# Patient Record
Sex: Female | Born: 1970
Health system: Southern US, Community
[De-identification: ages and names within clinical notes are randomized; demographics above are authoritative.]

## PROBLEM LIST (undated history)

## (undated) DIAGNOSIS — I10 Essential (primary) hypertension: Secondary | ICD-10-CM

## (undated) DIAGNOSIS — M2550 Pain in unspecified joint: Secondary | ICD-10-CM

## (undated) HISTORY — DX: Pain in unspecified joint: M25.50

## (undated) HISTORY — PX: TUBAL LIGATION: SHX77

---

## 2019-06-22 DIAGNOSIS — I10 Essential (primary) hypertension: Secondary | ICD-10-CM | POA: Diagnosis not present

## 2019-06-22 DIAGNOSIS — Z0000 Encounter for general adult medical examination without abnormal findings: Secondary | ICD-10-CM | POA: Diagnosis not present

## 2019-06-22 DIAGNOSIS — Z131 Encounter for screening for diabetes mellitus: Secondary | ICD-10-CM | POA: Diagnosis not present

## 2019-06-22 DIAGNOSIS — Z13220 Encounter for screening for lipoid disorders: Secondary | ICD-10-CM | POA: Diagnosis not present

## 2019-06-22 DIAGNOSIS — Z1159 Encounter for screening for other viral diseases: Secondary | ICD-10-CM | POA: Diagnosis not present

## 2019-06-22 DIAGNOSIS — R0602 Shortness of breath: Secondary | ICD-10-CM | POA: Diagnosis not present

## 2019-06-22 DIAGNOSIS — Z114 Encounter for screening for human immunodeficiency virus [HIV]: Secondary | ICD-10-CM | POA: Diagnosis not present

## 2019-06-22 DIAGNOSIS — Z79899 Other long term (current) drug therapy: Secondary | ICD-10-CM | POA: Diagnosis not present

## 2019-06-22 LAB — PULMONARY FUNCTION TEST

## 2019-07-05 DIAGNOSIS — R945 Abnormal results of liver function studies: Secondary | ICD-10-CM | POA: Diagnosis not present

## 2019-07-05 DIAGNOSIS — I10 Essential (primary) hypertension: Secondary | ICD-10-CM | POA: Diagnosis not present

## 2019-07-05 DIAGNOSIS — Z01419 Encounter for gynecological examination (general) (routine) without abnormal findings: Secondary | ICD-10-CM | POA: Diagnosis not present

## 2019-07-05 DIAGNOSIS — R7303 Prediabetes: Secondary | ICD-10-CM | POA: Diagnosis not present

## 2019-07-05 LAB — HM PAP SMEAR: HM Pap smear: NEGATIVE

## 2019-07-12 DIAGNOSIS — R945 Abnormal results of liver function studies: Secondary | ICD-10-CM | POA: Diagnosis not present

## 2019-07-19 DIAGNOSIS — K8020 Calculus of gallbladder without cholecystitis without obstruction: Secondary | ICD-10-CM | POA: Diagnosis not present

## 2019-07-19 DIAGNOSIS — I10 Essential (primary) hypertension: Secondary | ICD-10-CM | POA: Diagnosis not present

## 2019-07-19 DIAGNOSIS — R7303 Prediabetes: Secondary | ICD-10-CM | POA: Diagnosis not present

## 2019-07-19 DIAGNOSIS — N281 Cyst of kidney, acquired: Secondary | ICD-10-CM | POA: Diagnosis not present

## 2019-08-16 DIAGNOSIS — R7303 Prediabetes: Secondary | ICD-10-CM | POA: Diagnosis not present

## 2019-08-16 DIAGNOSIS — K8020 Calculus of gallbladder without cholecystitis without obstruction: Secondary | ICD-10-CM | POA: Diagnosis not present

## 2019-08-16 DIAGNOSIS — I10 Essential (primary) hypertension: Secondary | ICD-10-CM | POA: Diagnosis not present

## 2019-08-16 DIAGNOSIS — N281 Cyst of kidney, acquired: Secondary | ICD-10-CM | POA: Diagnosis not present

## 2020-01-27 DIAGNOSIS — K219 Gastro-esophageal reflux disease without esophagitis: Secondary | ICD-10-CM | POA: Diagnosis not present

## 2020-01-27 DIAGNOSIS — D539 Nutritional anemia, unspecified: Secondary | ICD-10-CM | POA: Diagnosis not present

## 2020-01-27 DIAGNOSIS — R5383 Other fatigue: Secondary | ICD-10-CM | POA: Diagnosis not present

## 2020-01-27 DIAGNOSIS — Z79899 Other long term (current) drug therapy: Secondary | ICD-10-CM | POA: Diagnosis not present

## 2020-01-27 DIAGNOSIS — R0602 Shortness of breath: Secondary | ICD-10-CM | POA: Diagnosis not present

## 2020-01-27 DIAGNOSIS — R7303 Prediabetes: Secondary | ICD-10-CM | POA: Diagnosis not present

## 2020-01-27 DIAGNOSIS — E559 Vitamin D deficiency, unspecified: Secondary | ICD-10-CM | POA: Diagnosis not present

## 2020-01-27 DIAGNOSIS — E7800 Pure hypercholesterolemia, unspecified: Secondary | ICD-10-CM | POA: Diagnosis not present

## 2020-01-27 DIAGNOSIS — I10 Essential (primary) hypertension: Secondary | ICD-10-CM | POA: Diagnosis not present

## 2020-01-27 DIAGNOSIS — Z20822 Contact with and (suspected) exposure to covid-19: Secondary | ICD-10-CM | POA: Diagnosis not present

## 2020-01-27 LAB — LIPID PANEL
Cholesterol: 133 (ref 0–200)
HDL: 55 (ref 35–70)
LDL Cholesterol: 39
Triglycerides: 196 — AB (ref 40–160)

## 2020-01-27 LAB — CBC AND DIFFERENTIAL
HCT: 34 — AB (ref 36–46)
Hemoglobin: 10.5 — AB (ref 12.0–16.0)
WBC: 8.8

## 2020-01-27 LAB — VITAMIN D 25 HYDROXY (VIT D DEFICIENCY, FRACTURES): Vit D, 25-Hydroxy: 26.05

## 2020-01-27 LAB — THYROID PANEL
T3, Total: 91.02
T4,Free (Direct): 1.16

## 2020-01-27 LAB — HEPATIC FUNCTION PANEL
ALT: 42 — AB (ref 7–35)
AST: 33 (ref 13–35)
Bilirubin, Total: 0.3

## 2020-01-27 LAB — HEMOGLOBIN A1C: Hemoglobin A1C: 5.3

## 2020-01-27 LAB — BASIC METABOLIC PANEL
BUN: 13 (ref 4–21)
Creatinine: 1.4 — AB (ref 0.5–1.1)

## 2020-01-27 LAB — CBC: MCV: 98 (ref 76–111)

## 2020-01-27 LAB — TSH: TSH: 1.26 (ref 0.41–5.90)

## 2020-06-01 ENCOUNTER — Ambulatory Visit: Payer: Self-pay | Admitting: Osteopathic Medicine

## 2020-06-20 ENCOUNTER — Ambulatory Visit (INDEPENDENT_AMBULATORY_CARE_PROVIDER_SITE_OTHER): Payer: Self-pay | Admitting: Osteopathic Medicine

## 2020-06-20 ENCOUNTER — Telehealth: Payer: Self-pay | Admitting: Osteopathic Medicine

## 2020-06-20 DIAGNOSIS — Z5329 Procedure and treatment not carried out because of patient's decision for other reasons: Secondary | ICD-10-CM

## 2020-06-20 NOTE — Telephone Encounter (Signed)
No-show to establish care with Dr. Lyn Hollingshead, 06/20/20. If late or no-show again, will not accept patient to this clinic. Please call herto inform her of this policy - I see she has been rescheduled already. Thanks!

## 2020-07-04 ENCOUNTER — Other Ambulatory Visit: Payer: Self-pay

## 2020-07-04 ENCOUNTER — Ambulatory Visit (INDEPENDENT_AMBULATORY_CARE_PROVIDER_SITE_OTHER): Payer: BC Managed Care – PPO | Admitting: Osteopathic Medicine

## 2020-07-04 ENCOUNTER — Encounter: Payer: Self-pay | Admitting: Osteopathic Medicine

## 2020-07-04 VITALS — BP 141/91 | HR 78 | Temp 98.0°F | Ht 66.0 in | Wt 177.0 lb

## 2020-07-04 DIAGNOSIS — N951 Menopausal and female climacteric states: Secondary | ICD-10-CM

## 2020-07-04 DIAGNOSIS — I10 Essential (primary) hypertension: Secondary | ICD-10-CM

## 2020-07-04 DIAGNOSIS — Z1211 Encounter for screening for malignant neoplasm of colon: Secondary | ICD-10-CM

## 2020-07-04 DIAGNOSIS — G8929 Other chronic pain: Secondary | ICD-10-CM

## 2020-07-04 DIAGNOSIS — K219 Gastro-esophageal reflux disease without esophagitis: Secondary | ICD-10-CM

## 2020-07-04 DIAGNOSIS — N926 Irregular menstruation, unspecified: Secondary | ICD-10-CM

## 2020-07-04 DIAGNOSIS — K760 Fatty (change of) liver, not elsewhere classified: Secondary | ICD-10-CM

## 2020-07-04 DIAGNOSIS — M545 Low back pain, unspecified: Secondary | ICD-10-CM

## 2020-07-04 DIAGNOSIS — Z1231 Encounter for screening mammogram for malignant neoplasm of breast: Secondary | ICD-10-CM

## 2020-07-04 MED ORDER — METOPROLOL SUCCINATE ER 25 MG PO TB24
25.0000 mg | ORAL_TABLET | Freq: Every day | ORAL | 3 refills | Status: DC
Start: 2020-07-04 — End: 2020-08-04

## 2020-07-04 MED ORDER — LOSARTAN POTASSIUM 50 MG PO TABS
50.0000 mg | ORAL_TABLET | Freq: Every day | ORAL | 3 refills | Status: DC
Start: 2020-07-04 — End: 2021-03-08

## 2020-07-04 NOTE — Progress Notes (Signed)
Kristine Arnold is a 49 y.o. female who presents to  [facility] at [facility]  today, 07/04/20, seeking care for the following:  . New to establish  . Low back pain on L - lifts a lot at work, worse w/ back extension . Irregular periods - have always been heavy now alternate between heavy and light, irregular timing. No pain. Hx anemia.  Marland Kitchen Heartburn - worse in evening  . HTN: taking Losartan 50 mg daily and Metoprolol 25 bg bid       ASSESSMENT & PLAN with other pertinent findings:  The primary encounter diagnosis was Chronic left-sided low back pain without sciatica. Diagnoses of Irregular periods, Perimenopausal, Gastroesophageal reflux disease without esophagitis, Family history of fatty liver, Essential hypertension, Colon cancer screening, and Encounter for screening mammogram for malignant neoplasm of breast were also pertinent to this visit.   1. Chronic left-sided low back pain without sciatica Printed info Pt declined Rx Consider PT Ibuprofen prn   2. Irregular periods 3. Perimenopausal Perimenopause Declined US / exam at this time Low threshold for GYN referral   4. Gastroesophageal reflux disease without esophagitis Lifestyle modifications discussed Antacids prn Consider H2B, PPI   5. Fatty liver Hepatic labs pending  6. Essential hypertension Simplify med regimen as below  7. Colon cancer screening Colonocsopy   8. Encounter for screening mammogram for malignant neoplasm of breast Mammogram     Patient Instructions  Will try once daily Rx for losartan and metoprolol  Keep track of your BP at home and call us with numbers   Will monitor menstrual bleeding  If anemic or if bleeding is worse will refer to OBGYN   See printed info for back pain  OK to take Ibuprofen up to 800 mg every 6 hours if needed  Labs today       Orders Placed This Encounter  Procedures  . MM 3D SCREEN BREAST BILATERAL  . CBC   . COMPLETE METABOLIC PANEL WITH GFR  . Lipid panel  . TSH  . Ambulatory referral to Gastroenterology    Meds ordered this encounter  Medications  . losartan (COZAAR) 50 MG tablet    Sig: Take 1 tablet (50 mg total) by mouth daily.    Dispense:  90 tablet    Refill:  3  . metoprolol succinate (TOPROL-XL) 25 MG 24 hr tablet    Sig: Take 1 tablet (25 mg total) by mouth daily.    Dispense:  90 tablet    Refill:  3       Follow-up instructions: Return for RECHECK PENDING RESULTS / IF WORSE OR CHANGE, OTHERWISE ANNUAL WHEN DUE / IN 6 MOS .                                         BP (!) 141/91 (BP Location: Left Arm, Patient Position: Sitting)   Pulse 78   Temp 98 F (36.7 C)   Ht 5\' 6"  (1.676 m)   Wt 177 lb (80.3 kg)   LMP 06/21/2020 (Exact Date)   SpO2 98%   BMI 28.57 kg/m   Current Meds  Medication Sig  . losartan (COZAAR) 50 MG tablet Take 1 tablet (50 mg total) by mouth daily.  . metoprolol succinate (TOPROL-XL) 25 MG 24 hr tablet Take 1 tablet (25 mg total) by mouth daily.    No  results found for this or any previous visit (from the past 72 hour(s)).  No results found.     All questions at time of visit were answered - patient instructed to contact office with any additional concerns or updates.  ER/RTC precautions were reviewed with the patient as applicable.   Please note: voice recognition software was used to produce this document, and typos may escape review. Please contact Dr. Lyn Hollingshead for any needed clarifications.

## 2020-07-04 NOTE — Patient Instructions (Signed)
Will try once daily Rx for losartan and metoprolol  Keep track of your BP at home and call us with numbers   Will monitor menstrual bleeding  If anemic or if bleeding is worse will refer to OBGYN   See printed info for back pain  OK to take Ibuprofen up to 800 mg every 6 hours if needed  Labs today

## 2020-07-06 LAB — COMPLETE METABOLIC PANEL WITH GFR
AG Ratio: 1.1 (calc) (ref 1.0–2.5)
ALT: 32 U/L — ABNORMAL HIGH (ref 6–29)
AST: 33 U/L (ref 10–35)
Albumin: 4 g/dL (ref 3.6–5.1)
Alkaline phosphatase (APISO): 74 U/L (ref 37–153)
BUN: 16 mg/dL (ref 7–25)
CO2: 23 mmol/L (ref 20–32)
Calcium: 9.1 mg/dL (ref 8.6–10.4)
Chloride: 105 mmol/L (ref 98–110)
Creat: 0.97 mg/dL (ref 0.50–1.05)
GFR, Est African American: 79 mL/min/{1.73_m2} (ref >=60)
GFR, Est Non African American: 68 mL/min/{1.73_m2} (ref >=60)
Globulin: 3.5 g/dL (calc) (ref 1.9–3.7)
Glucose, Bld: 101 mg/dL — ABNORMAL HIGH (ref 65–99)
Potassium: 4 mmol/L (ref 3.5–5.3)
Sodium: 137 mmol/L (ref 135–146)
Total Bilirubin: 0.2 mg/dL (ref 0.2–1.2)
Total Protein: 7.5 g/dL (ref 6.1–8.1)

## 2020-07-06 LAB — LIPID PANEL
Cholesterol: 134 mg/dL (ref <200)
HDL: 52 mg/dL (ref >=50)
LDL Cholesterol (Calc): 65 mg/dL (calc)
Non-HDL Cholesterol (Calc): 82 mg/dL (calc) (ref <130)
Total CHOL/HDL Ratio: 2.6 (calc) (ref <5.0)
Triglycerides: 88 mg/dL (ref <150)

## 2020-07-06 LAB — CBC
HCT: 36.7 % (ref 35.0–45.0)
Hemoglobin: 12.1 g/dL (ref 11.7–15.5)
MCH: 30.6 pg (ref 27.0–33.0)
MCHC: 33 g/dL (ref 32.0–36.0)
MCV: 92.9 fL (ref 80.0–100.0)
MPV: 10.6 fL (ref 7.5–12.5)
Platelets: 410 10*3/uL — ABNORMAL HIGH (ref 140–400)
RBC: 3.95 10*6/uL (ref 3.80–5.10)
RDW: 12.7 % (ref 11.0–15.0)
WBC: 7.9 10*3/uL (ref 3.8–10.8)

## 2020-07-06 LAB — TSH: TSH: 1.89 mIU/L

## 2020-08-04 ENCOUNTER — Telehealth: Payer: Self-pay | Admitting: Osteopathic Medicine

## 2020-08-04 MED ORDER — METOPROLOL TARTRATE 25 MG PO TABS
25.0000 mg | ORAL_TABLET | Freq: Two times a day (BID) | ORAL | 3 refills | Status: DC
Start: 2020-08-04 — End: 2021-07-26

## 2020-08-04 NOTE — Telephone Encounter (Signed)
Medication was changed to allow for once daily dosing. Previously on 25mg  metoprolol tartrate which is a shorter acting medication and must be taken twice a day. The new metoprolol is an extended release that only requires one daily dose. A full year of this extended release metoprolol was called into her pharmacy. Instructions regarding this were included on her AVS.

## 2020-08-04 NOTE — Telephone Encounter (Signed)
Contacted pt regarding her metoprolol rx. Pt prefers to stay on her previous dose 25 mg/bid. She states that a 90 day rx cost $3 dollars. Requesting covering provider to send in original rx to [facility] (updated in pt's chart). Pt is currently out of medication.

## 2020-08-04 NOTE — Telephone Encounter (Signed)
New prescription sent in for metoprolol tartrate 25mg  BID, 90 day supply with 3 refills to [facility] on . As requested.

## 2020-08-04 NOTE — Telephone Encounter (Signed)
Patient stopped by, questioning why Dr Lyn Hollingshead changed her medication after just seeing her one time, stating it it 10 times more pricey than before and Dr. Mervyn Skeeters didn't state why she chaged it. Medication: metoprolol succinate (TOPROL-XL) 25 MG 24 hr tablet, and also states she is out medication and needs a refill, and wants to change her pharmacy as well. Wants a call back today regarding this, please advise. AM  New Pharmacy:  [facility] Address: [address] [place], [place], [place] [address] Phone: [phone]

## 2020-08-04 NOTE — Telephone Encounter (Signed)
Routing to covering provider for recommendation.

## 2020-08-11 NOTE — Telephone Encounter (Signed)
Task completed. Left a detailed vm msg that original rx was sent to local pharmacy on 08/02/20. Direct call back info provided.

## 2020-09-25 ENCOUNTER — Encounter: Payer: Self-pay | Admitting: Osteopathic Medicine

## 2021-01-02 ENCOUNTER — Emergency Department (INDEPENDENT_AMBULATORY_CARE_PROVIDER_SITE_OTHER): Payer: BC Managed Care – PPO

## 2021-01-02 ENCOUNTER — Emergency Department
Admission: RE | Admit: 2021-01-02 | Discharge: 2021-01-02 | Disposition: A | Discharge status: Home / Self Care | Payer: BC Managed Care – PPO | Source: Ambulatory Visit

## 2021-01-02 ENCOUNTER — Other Ambulatory Visit: Payer: Self-pay

## 2021-01-02 VITALS — BP 184/130 | HR 85 | Temp 98.7°F | Resp 16

## 2021-01-02 DIAGNOSIS — I10 Essential (primary) hypertension: Secondary | ICD-10-CM

## 2021-01-02 DIAGNOSIS — R059 Cough, unspecified: Secondary | ICD-10-CM | POA: Diagnosis not present

## 2021-01-02 DIAGNOSIS — J069 Acute upper respiratory infection, unspecified: Secondary | ICD-10-CM | POA: Diagnosis not present

## 2021-01-02 DIAGNOSIS — R062 Wheezing: Secondary | ICD-10-CM | POA: Diagnosis not present

## 2021-01-02 DIAGNOSIS — R0602 Shortness of breath: Secondary | ICD-10-CM

## 2021-01-02 MED ORDER — PROMETHAZINE-DM 6.25-15 MG/5ML PO SYRP: 5.0000 mL | ORAL_SOLUTION | Freq: Four times a day (QID) | ORAL | 0 refills | Status: DC | PRN

## 2021-01-02 MED ORDER — PREDNISONE 10 MG (21) PO TBPK: ORAL_TABLET | Freq: Every day | ORAL | 0 refills | Status: AC

## 2021-01-02 NOTE — ED Triage Notes (Signed)
Patient presents to Urgent Care with complaints of dry cough since about a week ago. Patient reports her cough is worse at night. Pt requesting imaging to rule out pneumonia.

## 2021-01-02 NOTE — Discharge Instructions (Signed)
Your xray was negative for pneumonia today.  I am going to treat you for bronchitis  I have sent in cough syrup for you to take. This medication can make you sleepy. Do not drive while taking this medication.  I have sent in a prednisone taper for you to take for 6 days. 6 tablets on day one, 5 tablets on day two, 4 tablets on day three, 3 tablets on day four, 2 tablets on day five, and 1 tablet on day six.  Your COVID test is pending.  You should self quarantine until the test result is back.    Take Tylenol or ibuprofen as needed for fever or discomfort.  Rest and keep yourself hydrated.    Follow-up with your primary care provider if your symptoms are not improving.

## 2021-01-02 NOTE — ED Provider Notes (Signed)
[facility]   [id number] 01/02/21 Arrival Time: 1841   CC: COVID symptoms  SUBJECTIVE: History from: patient.  Bambie Pizzolato is a 50 y.o. female who presents with dry cough x 7 days. Reports that her cough is worse at night. Reports pleuritic chest pain with coughing. Denies sick exposure to COVID, flu or strep. Denies recent travel. Has not taken her BP medication in a couple of days. Has negative history of Covid. Has not completed Covid vaccines. Has not taken OTC medications for this. Symptoms are worse with activity. Denies previous symptoms in the past. Denies fever, chills, fatigue, sinus pain, rhinorrhea, sore throat, SOB, nausea, changes in bowel or bladder habits.    ROS: As per HPI.  All other pertinent ROS negative.     Past Medical History:  Diagnosis Date  . Joint pain    Past Surgical History:  Procedure Laterality Date  . TUBAL LIGATION     No Known Allergies No current facility-administered medications on file prior to encounter.   Current Outpatient Medications on File Prior to Encounter  Medication Sig Dispense Refill  . losartan (COZAAR) 50 MG tablet Take 1 tablet (50 mg total) by mouth daily. 90 tablet 3  . metoprolol tartrate (LOPRESSOR) 25 MG tablet Take 1 tablet (25 mg total) by mouth 2 (two) times daily. 180 tablet 3   Social History   Socioeconomic History  . Marital status: Married    Spouse name: Not on file  . Number of children: Not on file  . Years of education: Not on file  . Highest education level: Not on file  Occupational History  . Not on file  Tobacco Use  . Smoking status: Never Smoker  . Smokeless tobacco: Never Used  Vaping Use  . Vaping Use: Never used  Substance and Sexual Activity  . Alcohol use: Yes    Comment: occ  . Drug use: Never  . Sexual activity: Yes    Partners: Male  Other Topics Concern  . Not on file  Social History Narrative  . Not on file   Social Determinants of Health   Financial Resource  Strain: Not on file  Food Insecurity: Not on file  Transportation Needs: Not on file  Physical Activity: Not on file  Stress: Not on file  Social Connections: Not on file  Intimate Partner Violence: Not on file   Family History  Problem Relation Age of Onset  . Heart disease Mother     OBJECTIVE:  Vitals:   01/02/21 1854  BP: (!) 184/130  Pulse: 85  Resp: 16  Temp: 98.7 F (37.1 C)  TempSrc: Oral  SpO2: 100%     General appearance: alert; appears fatigued, but nontoxic; speaking in full sentences and tolerating own secretions HEENT: NCAT; Ears: EACs clear, TMs pearly gray; Eyes: PERRL.  EOM grossly intact. Sinuses: nontender; Nose: nares patent with clear rhinorrhea, Throat: oropharynx erythematous, cobblestoning present, tonsils non erythematous or enlarged, uvula midline  Neck: supple without LAD Lungs: unlabored respirations, symmetrical air entry; cough: mild; no respiratory distress; coarse lung sounds to left lower lobe, wheezing noted to R lower lobe Heart: regular rate and rhythm.  Radial pulses 2+ symmetrical bilaterally Skin: warm and dry Psychological: alert and cooperative; normal mood and affect  LABS:  No results found for this or any previous visit (from the past 24 hour(s)).   ASSESSMENT & PLAN:  1. Viral URI with cough   2. Cough   3. Wheezing   4. Essential  hypertension     Meds ordered this encounter  Medications  . predniSONE (STERAPRED UNI-PAK 21 TAB) 10 MG (21) TBPK tablet    Sig: Take by mouth daily for 6 days. Take 6 tablets on day 1, 5 tablets on day 2, 4 tablets on day 3, 3 tablets on day 4, 2 tablets on day 5, 1 tablet on day 6    Dispense:  21 tablet    Refill:  0    Order Specific Question:   Supervising Provider    Answer:   Merrilee Jansky [id number]  . promethazine-dextromethorphan (PROMETHAZINE-DM) 6.25-15 MG/5ML syrup    Sig: Take 5 mLs by mouth 4 (four) times daily as needed for cough.    Dispense:  118 mL    Refill:  0     Order Specific Question:   Supervising Provider    Answer:   Merrilee Jansky [[id number]]   CXR negative Promethazine cough syrup prescribed Sedation precautions given Prednisone prescribed Continue supportive care at home COVID and flu testing ordered.  It will take between 2-3 days for test results. Someone will contact you regarding abnormal results.   Work note provided Patient should remain in quarantine until they have received Covid results.  If negative you may resume normal activities (go back to work/school) while practicing hand hygiene, social distance, and mask wearing.  If positive, patient should remain in quarantine for at least 5 days from symptom onset AND greater than 72 hours after symptoms resolution (absence of fever without the use of fever-reducing medication and improvement in respiratory symptoms), whichever is longer Get plenty of rest and push fluids Use OTC zyrtec for nasal congestion, runny nose, and/or sore throat Use OTC flonase for nasal congestion and runny nose Use medications daily for symptom relief Use OTC medications like ibuprofen or tylenol as needed fever or pain Call or go to the ED if you have any new or worsening symptoms such as fever, worsening cough, shortness of breath, chest tightness, chest pain, turning blue, changes in mental status.  Reviewed expectations re: course of current medical issues. Questions answered. Outlined signs and symptoms indicating need for more acute intervention. Patient verbalized understanding. After Visit Summary given.         Moshe Cipro, NP 01/02/21 1924

## 2021-01-05 LAB — NOVEL CORONAVIRUS, NAA: SARS-CoV-2, NAA: NOT DETECTED

## 2021-01-05 LAB — SARS-COV-2, NAA 2 DAY TAT

## 2021-01-30 ENCOUNTER — Ambulatory Visit (INDEPENDENT_AMBULATORY_CARE_PROVIDER_SITE_OTHER): Payer: BC Managed Care – PPO | Admitting: Osteopathic Medicine

## 2021-01-30 ENCOUNTER — Encounter: Payer: Self-pay | Admitting: Osteopathic Medicine

## 2021-01-30 ENCOUNTER — Other Ambulatory Visit: Payer: Self-pay

## 2021-01-30 VITALS — BP 158/115 | HR 86 | Temp 98.4°F | Wt 177.0 lb

## 2021-01-30 DIAGNOSIS — N926 Irregular menstruation, unspecified: Secondary | ICD-10-CM | POA: Diagnosis not present

## 2021-01-30 DIAGNOSIS — N63 Unspecified lump in unspecified breast: Secondary | ICD-10-CM | POA: Diagnosis not present

## 2021-01-30 DIAGNOSIS — N951 Menopausal and female climacteric states: Secondary | ICD-10-CM | POA: Diagnosis not present

## 2021-01-30 DIAGNOSIS — I10 Essential (primary) hypertension: Secondary | ICD-10-CM | POA: Diagnosis not present

## 2021-01-30 NOTE — Patient Instructions (Addendum)
Plan:  Breast mass - need mammogram and likely ultrasound. I've referred you for testing at [facility], please call them at [phone] if you haven't heard within a week about scheduling an appointment.   Bleeding is likely due to perimenopause. Will refer to OBGYN to talk about possible testing / procedures to stop bleeding.   Take BP medications same time daily! Monitor your BP at home few times per day over the next week. Will message you in 1 week, can respond to that w/ your BP numbers. Goal <140/90, ideally under 130/80.

## 2021-01-30 NOTE — Progress Notes (Signed)
Kristine Arnold is a 50 y.o. female who presents to  [facility] at [facility]  today, 01/30/21, seeking care for the following:  . Prolonged periods: bleeding up to 15 days out of the month. Last normal period this year was 07/2020, then longer in 08/2020, no cycle in 09/2020, one day spotting then few weeks later period from 11/10/20-12/08/20, 13 days bleeding starting 12/29/20 . Cyst in breast: reports hx cysts, this mass seems to be enlarging. Mammogram ordered by me in 06/2020 but never completed.  Marland Kitchen HTN: hasn't taken Rx, BP above goal   . Reviewed labs 06/2020: no concerns on TSH, Lipids, CMP, CBC      ASSESSMENT & PLAN with other pertinent findings:  The primary encounter diagnosis was Breast mass. Diagnoses of Irregular periods, Perimenopausal, and Essential hypertension were also pertinent to this visit.   1. Breast mass --> breast ctr imaging   2. Irregular periods Likely perimenopausal but heavy bleeding No red flags for anemia  --> refer OBGYN to discuss need for EBx +/- procedures (ablation, hysterectomy) or meds to curb bleeding  3. Perimenopausal See #2  4. Essential hypertension --> pt to monitor bP at home, she is concerned for white coat HTN see pt instructions   Patient Instructions  Plan:  Breast mass - need mammogram and likely ultrasound. I've referred you for testing at [facility], please call them at [phone] if you haven't heard within a week about scheduling an appointment.   Bleeding is likely due to perimenopause. Will refer to OBGYN to talk about possible testing / procedures to stop bleeding.   Take BP medications same time daily! Monitor your BP at home few times per day over the next week. Will message you in 1 week, can respond to that w/ your BP numbers. Goal <140/90, ideally under 130/80.     Orders Placed This Encounter  Procedures  . US BREAST COMPLETE UNI RIGHT INC AXILLA  .  US BREAST LTD UNI LEFT INC AXILLA  . MM Digital Diagnostic Bilat  . Ambulatory referral to Obstetrics / Gynecology    No orders of the defined types were placed in this encounter.    See below for relevant physical exam findings  See below for recent lab and imaging results reviewed  Medications, allergies, PMH, PSH, SocH, FamH reviewed below    Follow-up instructions: Return for [facility] DUE 06/2021, OTHERWISE SEE [place] AS NEEDED! .                                        Exam:  BP (!) 158/115 (BP Location: Left Arm, Patient Position: Sitting, Cuff Size: Normal)   Pulse 86   Temp 98.4 F (36.9 C) (Oral)   Wt 177 lb 0.6 oz (80.3 kg)   BMI 28.58 kg/m   Constitutional: VS see above. General Appearance: alert, well-developed, well-nourished, NAD  Neck: No masses, trachea midline.   Respiratory: Normal respiratory effort. no wheeze, no rhonchi, no rales  Cardiovascular: S1/S2 normal, no murmur, no rub/gallop auscultated. RRR.   Musculoskeletal: Gait normal.   Neurological: Normal balance/coordination. No tremor.  Skin: warm, dry, intact.   Psychiatric: Normal judgment/insight. Normal mood and affect. Oriented x3.   Current Meds  Medication Sig  . losartan (COZAAR) 50 MG tablet Take 1 tablet (50 mg total) by mouth daily.  . metoprolol tartrate (LOPRESSOR) 25  MG tablet Take 1 tablet (25 mg total) by mouth 2 (two) times daily.    No Known Allergies  There are no problems to display for this patient.   Family History  Problem Relation Age of Onset  . Heart disease Mother     Social History   Tobacco Use  Smoking Status Never Smoker  Smokeless Tobacco Never Used    Past Surgical History:  Procedure Laterality Date  . TUBAL LIGATION      Immunization History  Administered Date(s) Administered  . PFIZER(Purple Top)SARS-COV-2 Vaccination 02/05/2020, 02/26/2020, 09/11/2020    Recent Results (from the past 2160 hour(s))   Novel Coronavirus, NAA ([facility])     Status: None   Collection Time: 01/02/21  7:00 PM   Specimen: Nasopharyngeal(NP) swabs in vial transport medium   Nasopharynge  Result Value Ref Range   SARS-CoV-2, NAA Not Detected Not Detected    Comment: This nucleic acid amplification test was developed and its performance characteristics determined by [employer]. Nucleic acid amplification tests include RT-PCR and TMA. This test has not been FDA cleared or approved. This test has been authorized by FDA under an Emergency Use Authorization (EUA). This test is only authorized for the duration of time the declaration that circumstances exist justifying the authorization of the emergency use of in vitro diagnostic tests for detection of SARS-CoV-2 virus and/or diagnosis of COVID-19 infection under section 564(b)(1) of the Act, 21 U.S.C. 774JOI-7(O) (1), unless the authorization is terminated or revoked sooner. When diagnostic testing is negative, the possibility of a false negative result should be considered in the context of a patient's recent exposures and the presence of clinical signs and symptoms consistent with COVID-19. An individual without symptoms of COVID-19 and who is not shedding SARS-CoV-2 virus wo uld expect to have a negative (not detected) result in this assay.   SARS-COV-2, NAA 2 DAY TAT     Status: None   Collection Time: 01/02/21  7:00 PM   Nasopharynge  Result Value Ref Range   SARS-CoV-2, NAA 2 DAY TAT Performed     No results found.     All questions at time of visit were answered - patient instructed to contact office with any additional concerns or updates. ER/RTC precautions were reviewed with the patient as applicable.   Please note: manual typing as well as voice recognition software may have been used to produce this document - typos may escape review. Please contact Dr. Lyn Hollingshead for any needed clarifications.

## 2021-02-12 ENCOUNTER — Encounter: Payer: Self-pay | Admitting: Obstetrics and Gynecology

## 2021-02-12 ENCOUNTER — Ambulatory Visit: Payer: BC Managed Care – PPO | Admitting: Obstetrics and Gynecology

## 2021-02-12 ENCOUNTER — Other Ambulatory Visit: Payer: Self-pay

## 2021-02-12 ENCOUNTER — Other Ambulatory Visit (HOSPITAL_COMMUNITY)
Admission: RE | Admit: 2021-02-12 | Discharge: 2021-02-12 | Disposition: A | Discharge status: Home / Self Care | Payer: BC Managed Care – PPO | Source: Ambulatory Visit | Attending: Obstetrics and Gynecology | Admitting: Obstetrics and Gynecology

## 2021-02-12 ENCOUNTER — Other Ambulatory Visit: Payer: Self-pay | Admitting: Obstetrics and Gynecology

## 2021-02-12 VITALS — BP 179/112 | HR 85 | Ht 66.0 in | Wt 179.0 lb

## 2021-02-12 DIAGNOSIS — Z01419 Encounter for gynecological examination (general) (routine) without abnormal findings: Secondary | ICD-10-CM

## 2021-02-12 DIAGNOSIS — Z113 Encounter for screening for infections with a predominantly sexual mode of transmission: Secondary | ICD-10-CM | POA: Insufficient documentation

## 2021-02-12 DIAGNOSIS — Z1231 Encounter for screening mammogram for malignant neoplasm of breast: Secondary | ICD-10-CM | POA: Diagnosis not present

## 2021-02-12 DIAGNOSIS — Z124 Encounter for screening for malignant neoplasm of cervix: Secondary | ICD-10-CM | POA: Insufficient documentation

## 2021-02-12 DIAGNOSIS — I10 Essential (primary) hypertension: Secondary | ICD-10-CM

## 2021-02-12 DIAGNOSIS — N939 Abnormal uterine and vaginal bleeding, unspecified: Secondary | ICD-10-CM

## 2021-02-12 DIAGNOSIS — N840 Polyp of corpus uteri: Secondary | ICD-10-CM | POA: Diagnosis not present

## 2021-02-12 MED ORDER — MEDROXYPROGESTERONE ACETATE 10 MG PO TABS: 20.0000 mg | ORAL_TABLET | Freq: Every day | ORAL | 2 refills | Status: DC

## 2021-02-12 NOTE — Progress Notes (Signed)
ENDOMETRIAL BIOPSY      Zissy Hamlett is a 50 y.o. [id number] here for endometrial biopsy.  The indications for endometrial biopsy were reviewed.  Risks of the biopsy including cramping, bleeding, infection, uterine perforation, inadequate specimen and need for additional procedures were discussed. The patient states she understands and agrees to undergo procedure today. Consent was signed. Time out was performed.   Indications: irregular bleeding Urine HCG: negative  A bivalve speculum was placed into the vagina and the cervix was easily visualized and was prepped with Betadine x2. A single-toothed tenaculum was placed on the anterior lip of the cervix to stabilize it. The 3 mm pipelle was introduced into the endometrial cavity without difficulty to a depth of 10 cm, and a moderate amount of tissue was obtained and sent to pathology. This was repeated for a total of 3 passes. The instruments were removed from the patient's vagina. Minimal bleeding from the cervix at the tenaculum was noted.   The patient tolerated the procedure well. Routine post-procedure instructions were given to the patient.    Will base further management on results of biopsy.  Baldemar Lenis, MD, [facility] for [facility])

## 2021-02-12 NOTE — Progress Notes (Signed)
Elevated BP- Pt is monitored by PCP Pt missed period in November- every period since then has been 2 weeks long. Periods became heavier and painful Last pap- 2021- normal Never had mamm- it has been ordered by PCP

## 2021-02-12 NOTE — Progress Notes (Signed)
GYNECOLOGY OFFICE NOTE  History:  50 y.o. [id number] here today for irregular menses. No period in November, bled for 1 month straight in December, 2 weeks in January then a normal period in February. Periods are usually 5-6 days. This bleeding was not heavier than usual for her but was just longer than usual. She has no other complaints. Desires STI screening.     Past Medical History:  Diagnosis Date  . Joint pain     Past Surgical History:  Procedure Laterality Date  . TUBAL LIGATION       Current Outpatient Medications:  .  losartan (COZAAR) 50 MG tablet, Take 1 tablet (50 mg total) by mouth daily., Disp: 90 tablet, Rfl: 3 .  metoprolol tartrate (LOPRESSOR) 25 MG tablet, Take 1 tablet (25 mg total) by mouth 2 (two) times daily., Disp: 180 tablet, Rfl: 3 .  promethazine-dextromethorphan (PROMETHAZINE-DM) 6.25-15 MG/5ML syrup, Take 5 mLs by mouth 4 (four) times daily as needed for cough. (Patient not taking: Reported on 01/30/2021), Disp: 118 mL, Rfl: 0  The following portions of the patient's history were reviewed and updated as appropriate: allergies, current medications, past family history, past medical history, past social history, past surgical history and problem list.   Review of Systems:  Pertinent items noted in HPI and remainder of comprehensive ROS otherwise negative.   Objective:  Physical Exam BP (!) 179/112   Pulse 85   Ht 5\' 6"  (1.676 m)   Wt 179 lb (81.2 kg)   LMP 01/30/2021   BMI 28.89 kg/m  CONSTITUTIONAL: Well-developed, well-nourished female in no acute distress.  HENT:  Normocephalic, atraumatic. External right and left ear normal. Oropharynx is clear and moist EYES: Conjunctivae and EOM are normal. Pupils are equal, round, and reactive to light. No scleral icterus.  NECK: Normal range of motion, supple, no masses SKIN: Skin is warm and dry. No rash noted. Not diaphoretic. No erythema. No pallor. NEUROLOGIC: Alert and oriented to person, place, and time.  Normal reflexes, muscle tone coordination. No cranial nerve deficit noted. PSYCHIATRIC: Normal mood and affect. Normal behavior. Normal judgment and thought content. CARDIOVASCULAR: Normal heart rate noted RESPIRATORY: Effort normal, no problems with respiration noted ABDOMEN: Soft, no distention noted.   PELVIC: Normal appearing external genitalia; normal appearing vaginal mucosa and cervix.  No abnormal discharge noted.  Pap smear obtained.  pelvic cultures obtained. Normal uterine size, no other palpable masses, no uterine or adnexal tenderness. EMB performed, see note MUSCULOSKELETAL: Normal range of motion. No edema noted.  Exam done with chaperone present.  Labs and Imaging No results found.  Assessment & Plan:  1. Elevated BP  2. Encounter for screening mammogram for malignant neoplasm of breast Has been ordered through primary care  3. Cervical cancer screening - Cytology - PAP( [place])  4. Routine screening for STI (sexually transmitted infection) - Cervicovaginal ancillary only( [place]) - HIV antibody (with reflex) - Hepatitis B surface antigen - Hepatitis C antibody - RPR  5. Abnormal uterine bleeding - likely perimenopausal irregular bleeding - rec EMB, done today, see note - reviewed options for management including hormonal, she is agreeable to starting hormonal control - Surgical pathology( [place]/ [facility])  6. Primary hypertension - states she is waiting on call from PCP to update meds - Encouraged patient to call for follow up with primary care - briefly reviewed risks involved with poorly controlled hypertension   Routine preventative health maintenance measures emphasized. Please refer to After Visit Summary for  other counseling recommendations.   Return in about 6 months (around 08/15/2021) for Followup.  I have spent a total of 35 minutes of face-to-face and non-face-to-face time, excluding clinical staff time, reviewing notes and  preparing to see patient, ordering tests and/or medications, and counseling the patient.   Baldemar Lenis, MD, [facility] Attending Center for [facility])

## 2021-02-12 NOTE — Addendum Note (Signed)
Addended by: Leroy Libman on: 02/12/2021 12:17 PM   Modules accepted: Orders

## 2021-02-13 ENCOUNTER — Encounter: Payer: Self-pay | Admitting: Osteopathic Medicine

## 2021-02-13 LAB — HEPATITIS B SURFACE ANTIGEN: Hepatitis B Surface Ag: NONREACTIVE

## 2021-02-13 LAB — CERVICOVAGINAL ANCILLARY ONLY
Chlamydia: NEGATIVE
Comment: NEGATIVE
Comment: NEGATIVE
Comment: NORMAL
Neisseria Gonorrhea: NEGATIVE
Trichomonas: NEGATIVE

## 2021-02-13 LAB — CYTOLOGY - PAP
Comment: NEGATIVE
Diagnosis: NEGATIVE
High risk HPV: NEGATIVE

## 2021-02-13 LAB — HEPATITIS C ANTIBODY
Hepatitis C Ab: NONREACTIVE
SIGNAL TO CUT-OFF: 0.01 (ref <1.00)

## 2021-02-13 LAB — HIV ANTIBODY (ROUTINE TESTING W REFLEX): HIV 1&2 Ab, 4th Generation: NONREACTIVE

## 2021-02-13 LAB — RPR: RPR Ser Ql: NONREACTIVE

## 2021-02-14 ENCOUNTER — Encounter: Payer: Self-pay | Admitting: Osteopathic Medicine

## 2021-02-14 NOTE — Progress Notes (Signed)
Negative for intraepithelial lesion or malignancy.  

## 2021-02-15 ENCOUNTER — Telehealth: Payer: Self-pay | Admitting: *Deleted

## 2021-02-15 NOTE — Telephone Encounter (Signed)
-----   Message from Conan Bowens, MD sent at 02/15/2021  3:12 PM EDT ----- Endometrial polyp, please schedule f/u appt with MD for surg consult

## 2021-02-15 NOTE — Telephone Encounter (Signed)
Left patient a message to call and schedule. Please offer Dr. Jolayne Panther first on 03/08/2021.

## 2021-03-04 ENCOUNTER — Emergency Department
Admission: RE | Admit: 2021-03-04 | Discharge: 2021-03-04 | Disposition: A | Discharge status: Home / Self Care | Payer: BC Managed Care – PPO | Source: Ambulatory Visit | Attending: Family Medicine | Admitting: Family Medicine

## 2021-03-04 ENCOUNTER — Other Ambulatory Visit: Payer: Self-pay

## 2021-03-04 ENCOUNTER — Encounter: Payer: Self-pay | Admitting: Osteopathic Medicine

## 2021-03-04 VITALS — BP 166/113 | HR 83 | Temp 98.7°F | Resp 20 | Ht 66.0 in | Wt 173.0 lb

## 2021-03-04 DIAGNOSIS — J069 Acute upper respiratory infection, unspecified: Secondary | ICD-10-CM | POA: Diagnosis not present

## 2021-03-04 MED ORDER — BENZONATATE 200 MG PO CAPS: 200.0000 mg | ORAL_CAPSULE | Freq: Two times a day (BID) | ORAL | 0 refills | Status: DC | PRN

## 2021-03-04 MED ORDER — PREDNISONE 20 MG PO TABS
20.0000 mg | ORAL_TABLET | Freq: Two times a day (BID) | ORAL | 0 refills | Status: DC
Start: 2021-03-04 — End: 2021-03-08

## 2021-03-04 NOTE — ED Provider Notes (Signed)
[facility]    CSN: [id number] Arrival date & time: 03/04/21  1451      History   Chief Complaint Chief Complaint  Patient presents with  . Cough    Pt states that she has a cough, wheezing, sneezing. X3 days. Pt states that she gets covid tested everyday at work and her test was negative. X3 days    HPI Kristine Arnold is a 50 y.o. female.   HPI   Patient states her husband was seen 3 days ago.  Identified as having sinus infection.  Was placed on antibiotics.  She states now he is given it to me.  She states she has had cough sneezing and congestion for 3 days.  No fever or chills.  No headache or body ache.  She has had COVID vaccinations.  She states she gets Covid testing every day that she is at work. She also has elevated blood pressure today.  She states she did take her blood pressure medication.  Past Medical History:  Diagnosis Date  . Joint pain     There are no problems to display for this patient.   Past Surgical History:  Procedure Laterality Date  . TUBAL LIGATION      OB History    Gravida  4   Para  4   Term  4   Preterm      AB      Living  4     SAB      IAB      Ectopic      Multiple      Live Births               Home Medications    Prior to Admission medications   Medication Sig Start Date End Date Taking? Authorizing Provider  benzonatate (TESSALON) 200 MG capsule Take 1 capsule (200 mg total) by mouth 2 (two) times daily as needed for cough. 03/04/21  Yes Eustace Moore, MD  losartan (COZAAR) 50 MG tablet Take 1 tablet (50 mg total) by mouth daily. 07/04/20 07/04/21 Yes Sunnie Nielsen, DO  medroxyPROGESTERone (PROVERA) 10 MG tablet Take 2 tablets (20 mg total) by mouth daily. 02/12/21  Yes Conan Bowens, MD  metoprolol tartrate (LOPRESSOR) 25 MG tablet Take 1 tablet (25 mg total) by mouth 2 (two) times daily. 08/04/20  Yes Christen Butter, NP  predniSONE (DELTASONE) 20 MG tablet Take 1 tablet (20 mg total)  by mouth 2 (two) times daily with a meal. 03/04/21  Yes Eustace Moore, MD    Family History Family History  Problem Relation Age of Onset  . Heart disease Mother     Social History Social History   Tobacco Use  . Smoking status: Never Smoker  . Smokeless tobacco: Never Used  Vaping Use  . Vaping Use: Never used  Substance Use Topics  . Alcohol use: Yes    Comment: occ  . Drug use: Never     Allergies   Patient has no known allergies.   Review of Systems Review of Systems See HPI  Physical Exam Triage Vital Signs ED Triage Vitals  Enc Vitals Group     BP 03/04/21 1506 (!) 166/113     Pulse Rate 03/04/21 1506 83     Resp 03/04/21 1509 20     Temp 03/04/21 1506 98.7 F (37.1 C)     Temp Source 03/04/21 1506 Oral     SpO2 03/04/21 1506 96 %  Weight 03/04/21 1505 173 lb (78.5 kg)     Height 03/04/21 1505 5\' 6"  (1.676 m)     Head Circumference --      Peak Flow --      Pain Score 03/04/21 1505 0     Pain Loc --      Pain Edu? --      Excl. in GC? --    No data found.  Updated Vital Signs BP (!) 166/113 (BP Location: Right Arm)   Pulse 83   Temp 98.7 F (37.1 C) (Oral)   Resp 20   Ht 5\' 6"  (1.676 m)   Wt 78.5 kg   LMP 03/01/2021   SpO2 96%   BMI 27.92 kg/m   Physical Exam Constitutional:      General: She is not in acute distress.    Appearance: She is well-developed.  HENT:     Head: Normocephalic and atraumatic.     Right Ear: Tympanic membrane and ear canal normal.     Left Ear: Tympanic membrane and ear canal normal.     Nose: Congestion and rhinorrhea present.     Mouth/Throat:     Pharynx: No posterior oropharyngeal erythema.  Eyes:     Conjunctiva/sclera: Conjunctivae normal.     Pupils: Pupils are equal, round, and reactive to light.  Cardiovascular:     Rate and Rhythm: Normal rate and regular rhythm.     Heart sounds: Normal heart sounds.  Pulmonary:     Effort: Pulmonary effort is normal. No respiratory distress.      Breath sounds: Normal breath sounds.  Abdominal:     General: There is no distension.     Palpations: Abdomen is soft.  Musculoskeletal:        General: Normal range of motion.     Cervical back: Normal range of motion.  Skin:    General: Skin is warm and dry.  Neurological:     Mental Status: She is alert.  Psychiatric:        Behavior: Behavior normal.      UC Treatments / Results  Labs (all labs ordered are listed, but only abnormal results are displayed) Labs Reviewed - No data to display  EKG   Radiology No results found.  Procedures Procedures (including critical care time)  Medications Ordered in UC Medications - No data to display  Initial Impression / Assessment and Plan / UC Course  I have reviewed the triage vital signs and the nursing notes.  Pertinent labs & imaging results that were available during my care of the patient were reviewed by me and considered in my medical decision making (see chart for details).     Viral upper respiratory infection.  No indication for antibiotics. Final Clinical Impressions(s) / UC Diagnoses   Final diagnoses:  Viral URI with cough     Discharge Instructions     Your blood pressure is elevated.  Make sure you are taking your blood pressure medicines, and follow-up with your family doctor  Take prednisone 2 times a day.  This will help with the sinus congestion and runny nose Take Tessalon twice a day for cough.   Drink lots of water   ED Prescriptions    Medication Sig Dispense Auth. Provider   predniSONE (DELTASONE) 20 MG tablet Take 1 tablet (20 mg total) by mouth 2 (two) times daily with a meal. 10 tablet , MD   benzonatate (TESSALON) 200 MG capsule Take 1 capsule (200  mg total) by mouth 2 (two) times daily as needed for cough. 20 capsule Eustace Moore, MD     PDMP not reviewed this encounter.   Eustace Moore, MD 03/04/21 [phone]

## 2021-03-04 NOTE — Discharge Instructions (Signed)
Your blood pressure is elevated.  Make sure you are taking your blood pressure medicines, and follow-up with your family doctor  Take prednisone 2 times a day.  This will help with the sinus congestion and runny nose Take Tessalon twice a day for cough.   Drink lots of water

## 2021-03-04 NOTE — ED Triage Notes (Signed)
Pt states that she has cough, wheezing and sneezing. x3 days pt is vaccinated. Pt states that her job covid tests them everyday and her test was negative.

## 2021-03-08 ENCOUNTER — Encounter: Payer: Self-pay | Admitting: *Deleted

## 2021-03-08 ENCOUNTER — Encounter: Payer: Self-pay | Admitting: Obstetrics and Gynecology

## 2021-03-08 ENCOUNTER — Telehealth: Payer: Self-pay | Admitting: *Deleted

## 2021-03-08 ENCOUNTER — Other Ambulatory Visit: Payer: Self-pay

## 2021-03-08 ENCOUNTER — Ambulatory Visit: Payer: BC Managed Care – PPO | Admitting: Obstetrics and Gynecology

## 2021-03-08 ENCOUNTER — Telehealth: Payer: Self-pay

## 2021-03-08 VITALS — BP 191/116 | HR 73 | Ht 66.0 in | Wt 176.0 lb

## 2021-03-08 DIAGNOSIS — N939 Abnormal uterine and vaginal bleeding, unspecified: Secondary | ICD-10-CM

## 2021-03-08 DIAGNOSIS — N840 Polyp of corpus uteri: Secondary | ICD-10-CM | POA: Insufficient documentation

## 2021-03-08 MED ORDER — LOSARTAN POTASSIUM 100 MG PO TABS: 100.0000 mg | ORAL_TABLET | Freq: Every day | ORAL | 3 refills | Status: DC

## 2021-03-08 NOTE — Telephone Encounter (Signed)
Call to patient. Advised surgery date 04-18-21 at 1430 at [facility]. Arrive 1230. Time may change. Letter with instructions to follow.   Encounter closed.

## 2021-03-08 NOTE — Progress Notes (Signed)
First BP 206/122 Repeat BP 191/116 Pt is concerned about BP but, states she feels fine. BP is monitored by PCP

## 2021-03-08 NOTE — Progress Notes (Signed)
50 yo P4 presenting today to discuss results of endometrial biopsy and further management of AUB. Patient with irregular cycles for the past 6-7 months. She reports cycle lasting more than 5 days and heavier in flow at times. She reports skipping a period in November which was followed by a 4-week cycle. Her most recent period lasted 5 days. She has not used the previously prescribed provera. Patient is without any complaints  Past Medical History:  Diagnosis Date  . Joint pain    Past Surgical History:  Procedure Laterality Date  . TUBAL LIGATION     Family History  Problem Relation Age of Onset  . Heart disease Mother    Social History   Tobacco Use  . Smoking status: Never Smoker  . Smokeless tobacco: Never Used  Vaping Use  . Vaping Use: Never used  Substance Use Topics  . Alcohol use: Yes    Comment: occ  . Drug use: Never   ROS See pertinent in HPI. All other systems reviewed and non contributory Blood pressure (!) 191/116, pulse 73, height 5\' 6"  (1.676 m), weight 176 lb (79.8 kg), last menstrual period 03/01/2021. GENERAL: Well-developed, well-nourished female in no acute distress.  NEURO: alert and oriented x 3  01/2021 endometrial biopsy Endometrium, biopsy - ENDOMETRIAL POLYP(S). NO HYPERPLASIA OR MALIGNANCY. - INCIDENTAL FRAGMENTS OF BENIGN ENDOCERVICAL MUCOSA.  A/P 50 yo with AUB - Pathology results reviewed with the patient - Discussed surgical management with D&C hysteroscopy, polypectomy. Risks, benefits and alternatives were explained including but not limited to risks of bleeding, infection and damage to adjacent organs. Patient verbalized understanding and all questions were answered. Patient desires to proceed with surgery - patient to use provera prn for menorrhagia  - patient to follow up with PCP for HTN

## 2021-03-08 NOTE — Telephone Encounter (Addendum)
Per Dr. Mardelle Matte Mychart note:  "Increase Losartan from 50 mg to 100 mg. Can double up on the 50 mg pills you have unti they are out. OK to take 2 pills at same time. New Rx sent for 100 mg pills when out of the 50 mg".  Left a detailed vm msg for pt. Aware to check her message from provider on Mychart. Direct call back info provided.

## 2021-03-08 NOTE — Telephone Encounter (Signed)
Patient stopped by to say that she has been sending mychart messages about her blood pressure, which continues to rise. She stated that you told her that if it doesn't change that you would change the dosage. She stated that she would like if someone gave her a call or contacted her through mychart to advise her. tvt

## 2021-03-13 NOTE — Telephone Encounter (Signed)
Called patient back and she stated that she understood and that someone else had called and explained as well. Tvt

## 2021-04-02 ENCOUNTER — Other Ambulatory Visit: Payer: Self-pay | Admitting: Osteopathic Medicine

## 2021-04-02 ENCOUNTER — Ambulatory Visit
Admission: RE | Admit: 2021-04-02 | Discharge: 2021-04-02 | Disposition: A | Discharge status: Home / Self Care | Payer: BC Managed Care – PPO | Source: Ambulatory Visit | Attending: Osteopathic Medicine | Admitting: Osteopathic Medicine

## 2021-04-02 ENCOUNTER — Other Ambulatory Visit: Payer: Self-pay

## 2021-04-02 DIAGNOSIS — N63 Unspecified lump in unspecified breast: Secondary | ICD-10-CM

## 2021-04-02 DIAGNOSIS — N644 Mastodynia: Secondary | ICD-10-CM | POA: Diagnosis not present

## 2021-04-13 ENCOUNTER — Telehealth: Payer: Self-pay | Admitting: *Deleted

## 2021-04-13 NOTE — Telephone Encounter (Signed)
Call from [facility]. During pre-operative screening earlier this week, patient reported intention to cancel surgery. Patient was instructed to call office to discuss.   Call to patient- requested call back to [phone] regarding appointments next week.

## 2021-04-16 ENCOUNTER — Telehealth: Payer: Self-pay | Admitting: *Deleted

## 2021-04-16 NOTE — Telephone Encounter (Signed)
Call from patient. States she received message about time change but she had already canceled surgery. Reports has to have a Friday in order to have caregiver. Does not plan to reschedule case.   Routing to provider for review.

## 2021-04-16 NOTE — Telephone Encounter (Signed)
See next phone message- time moved up to 1pm. Left message regarding time change.   Encounter closed.

## 2021-04-16 NOTE — Telephone Encounter (Signed)
See previous message- patient reported canceling surgery to Pre-op screening nurse. Have left message for patient to call.

## 2021-04-16 NOTE — Telephone Encounter (Signed)
Call to patient- left message of time chage to 1pm and arrive 11am.  Can call back to [phone].

## 2021-04-18 ENCOUNTER — Encounter (HOSPITAL_BASED_OUTPATIENT_CLINIC_OR_DEPARTMENT_OTHER): Admission: RE | Payer: Self-pay | Source: Home / Self Care

## 2021-04-18 ENCOUNTER — Ambulatory Visit (HOSPITAL_BASED_OUTPATIENT_CLINIC_OR_DEPARTMENT_OTHER)
Admission: RE | Admit: 2021-04-18 | Payer: BC Managed Care – PPO | Source: Home / Self Care | Admitting: Obstetrics and Gynecology

## 2021-04-18 SURGERY — DILATATION & CURETTAGE/HYSTEROSCOPY WITH MYOSURE
Anesthesia: Choice

## 2021-07-04 ENCOUNTER — Telehealth: Payer: Self-pay | Admitting: *Deleted

## 2021-07-04 NOTE — Telephone Encounter (Signed)
Left patient a message to call and schedule 6 month follow up appointment with Dr. Penne Lash around 08/15/2021.

## 2021-07-25 ENCOUNTER — Encounter: Payer: Self-pay | Admitting: Family Medicine

## 2021-07-26 ENCOUNTER — Other Ambulatory Visit: Payer: Self-pay | Admitting: Medical-Surgical

## 2021-08-20 ENCOUNTER — Other Ambulatory Visit: Payer: Self-pay | Admitting: Osteopathic Medicine

## 2021-09-17 ENCOUNTER — Other Ambulatory Visit: Payer: Self-pay | Admitting: Sports Medicine

## 2021-09-19 ENCOUNTER — Emergency Department (HOSPITAL_BASED_OUTPATIENT_CLINIC_OR_DEPARTMENT_OTHER)
Admission: EM | Admit: 2021-09-19 | Discharge: 2021-09-19 | Disposition: A | Discharge status: Home / Self Care | Payer: BC Managed Care – PPO | Attending: Emergency Medicine | Admitting: Emergency Medicine

## 2021-09-19 ENCOUNTER — Other Ambulatory Visit: Payer: Self-pay

## 2021-09-19 ENCOUNTER — Encounter (HOSPITAL_BASED_OUTPATIENT_CLINIC_OR_DEPARTMENT_OTHER): Payer: Self-pay

## 2021-09-19 ENCOUNTER — Emergency Department
Admission: RE | Admit: 2021-09-19 | Discharge: 2021-09-19 | Discharge status: Against Medical Advice | Payer: BC Managed Care – PPO | Source: Ambulatory Visit

## 2021-09-19 DIAGNOSIS — N938 Other specified abnormal uterine and vaginal bleeding: Secondary | ICD-10-CM | POA: Insufficient documentation

## 2021-09-19 DIAGNOSIS — N939 Abnormal uterine and vaginal bleeding, unspecified: Secondary | ICD-10-CM | POA: Diagnosis not present

## 2021-09-19 LAB — CBC WITH DIFFERENTIAL/PLATELET
Abs Immature Granulocytes: 0.02 10*3/uL (ref 0.00–0.07)
Basophils Absolute: 0.1 10*3/uL (ref 0.0–0.1)
Basophils Relative: 2 %
Eosinophils Absolute: 0.3 10*3/uL (ref 0.0–0.5)
Eosinophils Relative: 4 %
HCT: 35.9 % — ABNORMAL LOW (ref 36.0–46.0)
Hemoglobin: 12 g/dL (ref 12.0–15.0)
Immature Granulocytes: 0 %
Lymphocytes Relative: 25 %
Lymphs Abs: 1.8 10*3/uL (ref 0.7–4.0)
MCH: 32.2 pg (ref 26.0–34.0)
MCHC: 33.4 g/dL (ref 30.0–36.0)
MCV: 96.2 fL (ref 80.0–100.0)
Monocytes Absolute: 0.5 10*3/uL (ref 0.1–1.0)
Monocytes Relative: 7 %
Neutro Abs: 4.4 10*3/uL (ref 1.7–7.7)
Neutrophils Relative %: 62 %
Platelets: 416 10*3/uL — ABNORMAL HIGH (ref 150–400)
RBC: 3.73 MIL/uL — ABNORMAL LOW (ref 3.87–5.11)
RDW: 12.9 % (ref 11.5–15.5)
WBC: 7.1 10*3/uL (ref 4.0–10.5)
nRBC: 0 % (ref 0.0–0.2)

## 2021-09-19 LAB — PREGNANCY, URINE: Preg Test, Ur: NEGATIVE

## 2021-09-19 MED ORDER — MEGESTROL ACETATE 40 MG PO TABS: 40.0000 mg | ORAL_TABLET | Freq: Every day | ORAL | 0 refills | Status: AC

## 2021-09-19 NOTE — ED Triage Notes (Signed)
Pt c/o vaginal bleeding x 15 days-NAD-steady gait

## 2021-09-19 NOTE — Discharge Instructions (Addendum)
Take the Megace as prescribed  Follow up with Obgyn  Return for new or worsening symptoms

## 2021-09-19 NOTE — ED Provider Notes (Signed)
[facility] EMERGENCY DEPARTMENT Provider Note   CSN: [id number] Arrival date & time: 09/19/21  1228    History Chief Complaint  Patient presents with   Vaginal Bleeding    Leinani Lisbon is a 50 y.o. female with past medical history significant for abnormal uterine bleeding due to endometrial polyps who presents for evaluation of vaginal bleeding.  Patient states she has had this problem over the last year.  She is followed with Dr. Jolayne Panther. Was given prescription of Provera to help with symptoms if they did not improve at prior Obgyn visit.  She attempted starting Provera last week and she is currently on day #14 of her cycle. Provera has not helped. She changes her pad 4 times daily.  States she intermittently has clots the size of quarters.  No lightheadedness or dizziness.  She is compliant with her home medications.  States she was supposed to have surgery with biopsy for abnormal uterine bleeding however this had to be canceled and she has not rescheduled.  She denies any headache, lightness, dizziness, chest pain, shortness of breath abdominal pain, urinary symptoms, weakness.  Denies additional aggravating or alleviating factors.  History taken from patient and past medical records.  No interpreter used  HPI     Past Medical History:  Diagnosis Date   Joint pain     Patient Active Problem List   Diagnosis Date Noted   Abnormal uterine bleeding due to endometrial polyp 03/08/2021    Past Surgical History:  Procedure Laterality Date   TUBAL LIGATION       OB History     Gravida  4   Para  4   Term  4   Preterm      AB      Living  4      SAB      IAB      Ectopic      Multiple      Live Births              Family History  Problem Relation Age of Onset   Heart disease Mother     Social History   Tobacco Use   Smoking status: Never   Smokeless tobacco: Never  Vaping Use   Vaping Use: Never used  Substance Use Topics    Alcohol use: Not Currently   Drug use: Never    Home Medications Prior to Admission medications   Medication Sig Start Date End Date Taking? Authorizing Provider  megestrol (MEGACE) 40 MG tablet Take 1 tablet (40 mg total) by mouth daily for 5 days. 09/19/21 09/24/21 Yes Kamylah Manzo A, PA-C  benzonatate (TESSALON) 200 MG capsule Take 1 capsule (200 mg total) by mouth 2 (two) times daily as needed for cough. 03/04/21   Eustace Moore, MD  losartan (COZAAR) 100 MG tablet Take 1 tablet (100 mg total) by mouth daily. 03/08/21   Sunnie Nielsen, DO  medroxyPROGESTERone (PROVERA) 10 MG tablet Take 2 tablets (20 mg total) by mouth daily. Patient not taking: Reported on 03/08/2021 02/12/21   Conan Bowens, MD  metoprolol tartrate (LOPRESSOR) 25 MG tablet Take 1 tablet (25 mg total) by mouth 2 (two) times daily. **PATIENT NEEDS OFFICE VISIT FOR ADDITIONAL REFILLS** 08/20/21   Monica Becton, MD    Allergies    Patient has no known allergies.  Review of Systems   Review of Systems  Constitutional: Negative.   HENT: Negative.    Respiratory: Negative.  Cardiovascular: Negative.   Gastrointestinal: Negative.   Genitourinary:  Positive for menstrual problem and vaginal bleeding. Negative for decreased urine volume, difficulty urinating, dysuria, flank pain, frequency, hematuria, pelvic pain, urgency, vaginal discharge and vaginal pain.  Musculoskeletal: Negative.   Skin: Negative.   Neurological: Negative.   All other systems reviewed and are negative.  Physical Exam Updated Vital Signs BP (!) 151/103   Pulse 90   Temp 98.3 F (36.8 C) (Oral)   Resp 15   Ht 5\' 6"  (1.676 m)   Wt 79.4 kg   SpO2 100%   BMI 28.25 kg/m   Physical Exam Vitals and nursing note reviewed. Exam conducted with a chaperone present.  Constitutional:      General: She is not in acute distress.    Appearance: She is well-developed. She is not ill-appearing.  HENT:     Head: Normocephalic and  atraumatic.     Nose: Nose normal.     Mouth/Throat:     Mouth: Mucous membranes are moist.  Eyes:     Pupils: Pupils are equal, round, and reactive to light.  Cardiovascular:     Rate and Rhythm: Normal rate.     Pulses: Normal pulses.     Heart sounds: Normal heart sounds.  Pulmonary:     Effort: Pulmonary effort is normal. No respiratory distress.     Breath sounds: Normal breath sounds.  Abdominal:     General: Bowel sounds are normal. There is no distension.     Palpations: Abdomen is soft.  Genitourinary:    Comments: Normal appearing external female genitalia without rashes or lesions, normal vaginal epithelium. Normal appearing cervix without discharge or petechiae. Blood at cervical os. No Odor. Bimanual: No CMT, non-tender.  No palpable adnexal masses or tenderness. Uterus midline and not fixed. Rectovaginal exam was deferred.  No cystocele or rectocele noted. No pelvic lymphadenopathy noted. Exam performed with chaperone in room.   Musculoskeletal:        General: Normal range of motion.     Cervical back: Normal range of motion.  Skin:    General: Skin is warm and dry.     Capillary Refill: Capillary refill takes less than 2 seconds.  Neurological:     General: No focal deficit present.     Mental Status: She is alert.  Psychiatric:        Mood and Affect: Mood normal.    ED Results / Procedures / Treatments   Labs (all labs ordered are listed, but only abnormal results are displayed) Labs Reviewed  CBC WITH DIFFERENTIAL/PLATELET - Abnormal; Notable for the following components:      Result Value   RBC 3.73 (*)    HCT 35.9 (*)    Platelets 416 (*)    All other components within normal limits  PREGNANCY, URINE    EKG None  Radiology No results found.  Procedures Procedures   Medications Ordered in ED Medications - No data to display  ED Course  I have reviewed the triage vital signs and the nursing notes.  Pertinent labs & imaging results that  were available during my care of the patient were reviewed by me and considered in my medical decision making (see chart for details).  Here for AUB.  Afebrile, nonseptic, not ill-appearing.  Seems like this is been ongoing issue.  She is taking prescription of Provera at home without relief.  Changing her pad 4 times daily.  No lightheadedness or dizziness.  No hypotension, tachycardia.  In fact she is actually hypertensive however is not taken her home meds today.  Low suspicion for hypertensive urgency or emergency.  Pregnancy test CBC with baseline hemoglobin  GU exam with small blood at os without tenderness. No hemorrhage.   At this time I do not feel patient needs ultrasound has this seems like an acute on chronic problem.  She has follow-up with OB/GYN.  Discussed risk vs benefit of starting  hormonal medication for bleeding. Patient voiced understanding and is agreeable to start on meds. Will start on Megace.  Discussed strict return precautions.  She can return for new or worsening symptoms  The patient has been appropriately medically screened and/or stabilized in the ED. I have low suspicion for any other emergent medical condition which would require further screening, evaluation or treatment in the ED or require inpatient management.  Patient is hemodynamically stable and in no acute distress.  Patient able to ambulate in department prior to ED.  Evaluation does not show acute pathology that would require ongoing or additional emergent interventions while in the emergency department or further inpatient treatment.  I have discussed the diagnosis with the patient and answered all questions.  Pain is been managed while in the emergency department and patient has no further complaints prior to discharge.  Patient is comfortable with plan discussed in room and is stable for discharge at this time.  I have discussed strict return precautions for returning to the emergency department.  Patient  was encouraged to follow-up with PCP/specialist refer to at discharge.     MDM Rules/Calculators/A&P                            Final Clinical Impression(s) / ED Diagnoses Final diagnoses:  Abnormal uterine bleeding (AUB)    Rx / DC Orders ED Discharge Orders          Ordered    megestrol (MEGACE) 40 MG tablet  Daily        09/19/21 1856             Bradin Mcadory A, PA-C 09/19/21 1913    Sloan Leiter, DO 09/19/21 2004

## 2021-10-05 ENCOUNTER — Other Ambulatory Visit: Payer: BC Managed Care – PPO

## 2021-10-09 ENCOUNTER — Encounter: Payer: Self-pay | Admitting: Physician Assistant

## 2021-10-09 ENCOUNTER — Ambulatory Visit (INDEPENDENT_AMBULATORY_CARE_PROVIDER_SITE_OTHER): Payer: BC Managed Care – PPO | Admitting: Physician Assistant

## 2021-10-09 VITALS — BP 176/112 | HR 88 | Temp 98.7°F | Ht 66.0 in | Wt 184.0 lb

## 2021-10-09 DIAGNOSIS — Z23 Encounter for immunization: Secondary | ICD-10-CM

## 2021-10-09 DIAGNOSIS — Z13220 Encounter for screening for lipoid disorders: Secondary | ICD-10-CM

## 2021-10-09 DIAGNOSIS — I10 Essential (primary) hypertension: Secondary | ICD-10-CM | POA: Insufficient documentation

## 2021-10-09 DIAGNOSIS — E663 Overweight: Secondary | ICD-10-CM

## 2021-10-09 MED ORDER — TELMISARTAN-HCTZ 80-25 MG PO TABS
1.0000 | ORAL_TABLET | Freq: Every day | ORAL | 0 refills | Status: DC
Start: 2021-10-09 — End: 2021-11-05

## 2021-10-09 MED ORDER — METOPROLOL TARTRATE 25 MG PO TABS: 25.0000 mg | ORAL_TABLET | Freq: Two times a day (BID) | ORAL | 3 refills | Status: DC

## 2021-10-09 NOTE — Progress Notes (Signed)
   Subjective:    Patient ID: Kristine Arnold, female    DOB: [birth year], 50 y.o.   MRN: [id number]  HPI Pt is a 50 yo female who presents to the clinic for medication refills.   HTN-not checking BP at home. Taking cozaar 100mg  daily and metoprolol bid.  No CP, palpitations, headaches, dizziness, vision changes. Denies any daily alcohol use. Pt does not smoke.    Active Ambulatory Problems    Diagnosis Date Noted   Abnormal uterine bleeding due to endometrial polyp 03/08/2021   Uncontrolled hypertension 10/09/2021   Resolved Ambulatory Problems    Diagnosis Date Noted   No Resolved Ambulatory Problems   Past Medical History:  Diagnosis Date   Joint pain      Review of Systems  All other systems reviewed and are negative.     Objective:   Physical Exam Vitals reviewed.  Constitutional:      Appearance: Normal appearance.  HENT:     Head: Normocephalic.  Neck:     Vascular: No carotid bruit.  Cardiovascular:     Rate and Rhythm: Normal rate and regular rhythm.     Pulses: Normal pulses.     Heart sounds: Normal heart sounds.  Pulmonary:     Effort: Pulmonary effort is normal.     Breath sounds: Normal breath sounds.  Musculoskeletal:     Right lower leg: No edema.     Left lower leg: No edema.  Neurological:     General: No focal deficit present.     Mental Status: She is alert and oriented to person, place, and time.  Psychiatric:        Mood and Affect: Mood normal.          Assessment & Plan:  11/17/2022Marland KitchenIrelyn was seen today for hypertension.  Diagnoses and all orders for this visit:  Uncontrolled hypertension -     metoprolol tartrate (LOPRESSOR) 25 MG tablet; Take 1 tablet (25 mg total) by mouth 2 (two) times daily. -     telmisartan-hydrochlorothiazide (MICARDIS HCT) 80-25 MG tablet; Take 1 tablet by mouth daily. -     COMPLETE METABOLIC PANEL WITH GFR  Need for influenza vaccination -     Flu Vaccine QUAD 1mo+IM (Fluarix, Fluzone & Alfiuria Quad  PF)  Screening for lipid disorders -     Lipid Panel w/reflex Direct LDL  Overweight -     TSH  BP not to goal.  Stop losaartan.  Continue metoprolol. HR looks great.  Start micardis/HCTZ.  Follow up in 1 week.  Discussed low salt diet.  Fasting labs ordered.  Flu shot given today.

## 2021-10-09 NOTE — Patient Instructions (Signed)
Influenza (Flu) Vaccine (Inactivated or Recombinant): What You Need to Know 1. Why get vaccinated? Influenza vaccine can prevent influenza (flu). Flu is a contagious disease that spreads around the [place] every year, usually between October and May. Anyone can get the flu, but it is more dangerous for some people. Infants and young children, people 65 years and older, pregnant people, and people with certain health conditions or a weakened immune system are at greatest risk of flu complications. Pneumonia, bronchitis, sinus infections, and ear infections are examples of flu-related complications. If you have a medical condition, such as heart disease, cancer, or diabetes, flu can make it worse. Flu can cause fever and chills, sore throat, muscle aches, fatigue, cough, headache, and runny or stuffy nose. Some people may have vomiting and diarrhea, though this is more common in children than adults. In an average year, thousands of people in the [place] die from flu, and many more are hospitalized. Flu vaccine prevents millions of illnesses and flu-related visits to the doctor each year. 2. Influenza vaccines CDC recommends everyone 6 months and older get vaccinated every flu season. Children 6 months through 8 years of age may need 2 doses during a single flu season. Everyone else needs only 1 dose each flu season. It takes about 2 weeks for protection to develop after vaccination. There are many flu viruses, and they are always changing. Each year a new flu vaccine is made to protect against the influenza viruses believed to be likely to cause disease in the upcoming flu season. Even when the vaccine doesn't exactly match these viruses, it may still provide some protection. Influenza vaccine does not cause flu. Influenza vaccine may be given at the same time as other vaccines. 3. Talk with your health care provider Tell your vaccination provider if the person getting the vaccine: Has had  an allergic reaction after a previous dose of influenza vaccine, or has any severe, life-threatening allergies Has ever had Guillain-Barr Syndrome (also called "GBS") In some cases, your health care provider may decide to postpone influenza vaccination until a future visit. Influenza vaccine can be administered at any time during pregnancy. People who are or will be pregnant during influenza season should receive inactivated influenza vaccine. People with minor illnesses, such as a cold, may be vaccinated. People who are moderately or severely ill should usually wait until they recover before getting influenza vaccine. Your health care provider can give you more information. 4. Risks of a vaccine reaction Soreness, redness, and swelling where the shot is given, fever, muscle aches, and headache can happen after influenza vaccination. There may be a very small increased risk of Guillain-Barr Syndrome (GBS) after inactivated influenza vaccine (the flu shot). Young children who get the flu shot along with pneumococcal vaccine (PCV13) and/or DTaP vaccine at the same time might be slightly more likely to have a seizure caused by fever. Tell your health care provider if a child who is getting flu vaccine has ever had a seizure. People sometimes faint after medical procedures, including vaccination. Tell your provider if you feel dizzy or have vision changes or ringing in the ears. As with any medicine, there is a very remote chance of a vaccine causing a severe allergic reaction, other serious injury, or death. 5. What if there is a serious problem? An allergic reaction could occur after the vaccinated person leaves the clinic. If you see signs of a severe allergic reaction (hives, swelling of the face and throat, difficulty breathing,   a fast heartbeat, dizziness, or weakness), call 9-1-1 and get the person to the nearest hospital. For other signs that concern you, call your health care provider. Adverse  reactions should be reported to the Vaccine Adverse Event Reporting System (VAERS). Your health care provider will usually file this report, or you can do it yourself. Visit the VAERS website at www.vaers.hhs.gov or call 1-800-822-7967. VAERS is only for reporting reactions, and VAERS staff members do not give medical advice. 6. The National Vaccine Injury Compensation Program The National Vaccine Injury Compensation Program (VICP) is a federal program that was created to compensate people who may have been injured by certain vaccines. Claims regarding alleged injury or death due to vaccination have a time limit for filing, which may be as short as two years. Visit the VICP website at www.hrsa.gov/vaccinecompensation or call 1-800-338-2382 to learn about the program and about filing a claim. 7. How can I learn more? Ask your health care provider. Call your local or state health department. Visit the website of the Food and Drug Administration (FDA) for vaccine package inserts and additional information at www.fda.gov/vaccines-blood-biologics/vaccines. Contact the Centers for Disease Control and Prevention (CDC): Call 1-800-232-4636 (1-800-CDC-INFO) or Visit CDC's website at www.cdc.gov/flu. Vaccine Information Statement Inactivated Influenza Vaccine (06/30/2020) This information is not intended to replace advice given to you by your health care provider. Make sure you discuss any questions you have with your health care provider. Document Revised: 08/02/2021 Document Reviewed: 08/02/2021 Elsevier Patient Education  2022 Elsevier Inc.  

## 2021-10-10 LAB — COMPLETE METABOLIC PANEL WITH GFR
AG Ratio: 1.2 (calc) (ref 1.0–2.5)
ALT: 36 U/L — ABNORMAL HIGH (ref 6–29)
AST: 47 U/L — ABNORMAL HIGH (ref 10–35)
Albumin: 3.8 g/dL (ref 3.6–5.1)
Alkaline phosphatase (APISO): 68 U/L (ref 37–153)
BUN: 11 mg/dL (ref 7–25)
CO2: 24 mmol/L (ref 20–32)
Calcium: 8.8 mg/dL (ref 8.6–10.4)
Chloride: 105 mmol/L (ref 98–110)
Creat: 0.82 mg/dL (ref 0.50–1.03)
Globulin: 3.3 g/dL (calc) (ref 1.9–3.7)
Glucose, Bld: 92 mg/dL (ref 65–99)
Potassium: 4.3 mmol/L (ref 3.5–5.3)
Sodium: 138 mmol/L (ref 135–146)
Total Bilirubin: 0.2 mg/dL (ref 0.2–1.2)
Total Protein: 7.1 g/dL (ref 6.1–8.1)
eGFR: 87 mL/min/{1.73_m2} (ref >=60)

## 2021-10-10 LAB — LIPID PANEL W/REFLEX DIRECT LDL
Cholesterol: 148 mg/dL
HDL: 57 mg/dL
LDL Cholesterol (Calc): 68 mg/dL
Non-HDL Cholesterol (Calc): 91 mg/dL
Total CHOL/HDL Ratio: 2.6 (calc)
Triglycerides: 142 mg/dL

## 2021-10-10 LAB — TSH: TSH: 2.06 mIU/L

## 2021-10-10 NOTE — Progress Notes (Signed)
Cholesterol looks fabulous.  Thyroid looks great.  Kidney function looks good.  Liver enzymes are up. Avoid alcohol and tylenol products and will recheck in 2 weeks. We should do more of a work up if continues to be elevated.

## 2021-10-15 ENCOUNTER — Ambulatory Visit
Admit: 2021-10-15 | Discharge: 2021-10-15 | Disposition: A | Discharge status: Home / Self Care | Payer: BC Managed Care – PPO | Attending: Osteopathic Medicine | Admitting: Osteopathic Medicine

## 2021-10-15 ENCOUNTER — Other Ambulatory Visit: Payer: Self-pay | Admitting: Osteopathic Medicine

## 2021-10-15 ENCOUNTER — Other Ambulatory Visit: Payer: Self-pay

## 2021-10-15 DIAGNOSIS — N6321 Unspecified lump in the left breast, upper outer quadrant: Secondary | ICD-10-CM | POA: Diagnosis not present

## 2021-10-15 DIAGNOSIS — N63 Unspecified lump in unspecified breast: Secondary | ICD-10-CM

## 2021-10-15 DIAGNOSIS — N6322 Unspecified lump in the left breast, upper inner quadrant: Secondary | ICD-10-CM | POA: Diagnosis not present

## 2021-10-16 ENCOUNTER — Encounter: Payer: Self-pay | Admitting: Physician Assistant

## 2021-10-16 ENCOUNTER — Ambulatory Visit (INDEPENDENT_AMBULATORY_CARE_PROVIDER_SITE_OTHER): Payer: BC Managed Care – PPO | Admitting: Physician Assistant

## 2021-10-16 VITALS — BP 143/89 | HR 86

## 2021-10-16 DIAGNOSIS — I10 Essential (primary) hypertension: Secondary | ICD-10-CM | POA: Diagnosis not present

## 2021-10-16 NOTE — Progress Notes (Signed)
Pt here for blood pressure check.  Pt denies HAs, dizziness, SOB, CP or missed doses of meds.  Kristine Arnold, CMA

## 2021-10-16 NOTE — Progress Notes (Signed)
Patient ID: Kristine Arnold, female   DOB: 11/15/1971, 50 y.o.   MRN: [id number] BP not to goal but [facility] better. I would like to see under 140/90. Work on low salt diet and walking 30 minutes 5 times a week. OV in 3 months to determine if we need to add anything else to get to goal.

## 2021-10-16 NOTE — Progress Notes (Signed)
Pt informed of recommendations from [facility].  Pt expressed understanding and is agreeable.  Tiajuana Amass, CMA

## 2021-10-17 ENCOUNTER — Encounter: Payer: Self-pay | Admitting: Physician Assistant

## 2021-10-17 DIAGNOSIS — N6002 Solitary cyst of left breast: Secondary | ICD-10-CM | POA: Insufficient documentation

## 2021-10-17 NOTE — Progress Notes (Signed)
Looks like benign cyst in left breast. Recheck in 6 months with diagnostic mammogram.

## 2021-11-05 ENCOUNTER — Other Ambulatory Visit: Payer: Self-pay | Admitting: Physician Assistant

## 2021-11-05 DIAGNOSIS — I10 Essential (primary) hypertension: Secondary | ICD-10-CM

## 2022-01-28 ENCOUNTER — Telehealth: Payer: Self-pay | Admitting: Physician Assistant

## 2022-01-28 DIAGNOSIS — I10 Essential (primary) hypertension: Secondary | ICD-10-CM

## 2022-01-28 MED ORDER — TELMISARTAN-HCTZ 80-25 MG PO TABS: 1.0000 | ORAL_TABLET | Freq: Every day | ORAL | 0 refills | Status: DC

## 2022-01-28 NOTE — Telephone Encounter (Signed)
Patient has been scheduled for 02/18/22. AM ?

## 2022-01-28 NOTE — Telephone Encounter (Signed)
One month sent, overdue for visit.  ?Please call patient to schedule.  ?

## 2022-01-28 NOTE — Telephone Encounter (Signed)
Pt stopped in office to request a refill on telmisartan-hydrochlorothiazide. Pharmacy on file is correct ?

## 2022-01-31 ENCOUNTER — Other Ambulatory Visit: Payer: Self-pay | Admitting: Physician Assistant

## 2022-01-31 DIAGNOSIS — I10 Essential (primary) hypertension: Secondary | ICD-10-CM

## 2022-02-18 ENCOUNTER — Emergency Department
Admission: EM | Admit: 2022-02-18 | Discharge: 2022-02-18 | Disposition: A | Discharge status: Home / Self Care | Payer: BC Managed Care – PPO | Source: Home / Self Care

## 2022-02-18 ENCOUNTER — Ambulatory Visit (INDEPENDENT_AMBULATORY_CARE_PROVIDER_SITE_OTHER): Payer: BC Managed Care – PPO | Admitting: Physician Assistant

## 2022-02-18 ENCOUNTER — Other Ambulatory Visit: Payer: Self-pay

## 2022-02-18 ENCOUNTER — Encounter: Payer: Self-pay | Admitting: Emergency Medicine

## 2022-02-18 VITALS — BP 158/99 | HR 92 | Ht 66.0 in | Wt 175.0 lb

## 2022-02-18 DIAGNOSIS — I10 Essential (primary) hypertension: Secondary | ICD-10-CM | POA: Diagnosis not present

## 2022-02-18 DIAGNOSIS — R748 Abnormal levels of other serum enzymes: Secondary | ICD-10-CM

## 2022-02-18 DIAGNOSIS — Z1211 Encounter for screening for malignant neoplasm of colon: Secondary | ICD-10-CM | POA: Diagnosis not present

## 2022-02-18 DIAGNOSIS — J069 Acute upper respiratory infection, unspecified: Secondary | ICD-10-CM

## 2022-02-18 HISTORY — DX: Essential (primary) hypertension: I10

## 2022-02-18 MED ORDER — PREDNISONE 20 MG PO TABS: 20.0000 mg | ORAL_TABLET | Freq: Two times a day (BID) | ORAL | 0 refills | Status: DC

## 2022-02-18 MED ORDER — AZITHROMYCIN 250 MG PO TABS: ORAL_TABLET | ORAL | 0 refills | Status: DC

## 2022-02-18 MED ORDER — AMLODIPINE BESYLATE 2.5 MG PO TABS
2.5000 mg | ORAL_TABLET | Freq: Every day | ORAL | 0 refills | Status: DC
Start: 2022-02-18 — End: 2022-03-26

## 2022-02-18 MED ORDER — BENZONATATE 200 MG PO CAPS
200.0000 mg | ORAL_CAPSULE | Freq: Three times a day (TID) | ORAL | 0 refills | Status: DC | PRN
Start: 2022-02-18 — End: 2022-02-18

## 2022-02-18 MED ORDER — TELMISARTAN-HCTZ 80-25 MG PO TABS: 1.0000 | ORAL_TABLET | Freq: Every day | ORAL | 3 refills | Status: DC

## 2022-02-18 NOTE — ED Notes (Signed)
Has appt at 3pm today with Jade to discuss blood pressure meds. Did not want to wait and be seen for cough.  ?

## 2022-02-18 NOTE — ED Triage Notes (Signed)
Pt states she has been coughing since Thursday. Afebrile and has not taken any meds but states the cough is worse at night.  ?

## 2022-02-18 NOTE — Progress Notes (Signed)
? ?  Subjective:  ? ? Patient ID: Kristine Arnold, female    DOB: Oct 29, 1971, 51 y.o.   MRN: [id number] ? ?HPI ?Pt is a 51 yo female who presents to the clinic for BP follow up. She is taking her medication daily. She denies any CP, palpitations, headaches or vision changes. She only socially drinks. She denies any swelling or SOB. She checks her blood pressure from time to time and reamins in the 150's over 80 to 90s.  ? ? ?.. ?Active Ambulatory Problems  ?  Diagnosis Date Noted  ? Abnormal uterine bleeding due to endometrial polyp 03/08/2021  ? Uncontrolled hypertension 10/09/2021  ? Benign breast cyst in female, left 10/17/2021  ? Elevated liver enzymes 02/19/2022  ? ?Resolved Ambulatory Problems  ?  Diagnosis Date Noted  ? No Resolved Ambulatory Problems  ? ?Past Medical History:  ?Diagnosis Date  ? Hypertension   ? Joint pain   ? ? ? ? ?Review of Systems  ?All other systems reviewed and are negative. ? ?   ?Objective:  ? Physical Exam ?Vitals reviewed.  ?Constitutional:   ?   Appearance: Normal appearance. She is obese.  ?HENT:  ?   Head: Normocephalic.  ?Cardiovascular:  ?   Rate and Rhythm: Normal rate and regular rhythm.  ?   Pulses: Normal pulses.  ?Pulmonary:  ?   Effort: Pulmonary effort is normal.  ?   Breath sounds: Normal breath sounds.  ?Musculoskeletal:  ?   Right lower leg: No edema.  ?   Left lower leg: No edema.  ?Lymphadenopathy:  ?   Cervical: No cervical adenopathy.  ?Neurological:  ?   General: No focal deficit present.  ?   Mental Status: She is alert and oriented to person, place, and time.  ?Psychiatric:     ?   Mood and Affect: Mood normal.  ? ? ? ? ? ?   ?Assessment & Plan:  ?..Kristine Arnold was seen today for follow-up and hypertension. ? ?Diagnoses and all orders for this visit: ? ?Uncontrolled hypertension ?-     amLODipine (NORVASC) 2.5 MG tablet; Take 1 tablet (2.5 mg total) by mouth daily. ?-     COMPLETE METABOLIC PANEL WITH GFR ?-     telmisartan-hydrochlorothiazide (MICARDIS HCT) 80-25  MG tablet; Take 1 tablet by mouth daily. ? ?Colon cancer screening ?-     Cologuard ? ?Elevated liver enzymes ?-     COMPLETE METABOLIC PANEL WITH GFR ? ? ?BP not controlled. Discussed importance of control. ?Added norvasc, discussed side effects ?Continue metoprolol and micardis HCT.  ?Recheck cmp ?Follow up in 2 weeks with BP recheck ? ?

## 2022-02-18 NOTE — Patient Instructions (Signed)
Added norvasc 2.5mg   ? ?Amlodipine Tablets ?What is this medication? ?AMLODIPINE (am LOE di peen) treats high blood pressure and prevents chest pain (angina). It works by relaxing the blood vessels, which helps decrease the amount of work your heart has to do. It belongs to a group of medications called calcium channel blockers. ?This medicine may be used for other purposes; ask your health care provider or pharmacist if you have questions. ?COMMON BRAND NAME(S): Norvasc ?What should I tell my care team before I take this medication? ?They need to know if you have any of these conditions: ?Heart disease ?Liver disease ?An unusual or allergic reaction to amlodipine, other medications, foods, dyes, or preservatives ?Pregnant or trying to get pregnant ?Breast-feeding ?How should I use this medication? ?Take this medication by mouth. Take it as directed on the prescription label at the same time every day. You can take it with or without food. If it upsets your stomach, take it with food. Keep taking it unless your care team tells you to stop. ?Talk to your care team about the use of this medication in children. While it may be prescribed for children as young as 6 for selected conditions, precautions do apply. ?Overdosage: If you think you have taken too much of this medicine contact a poison control center or emergency room at once. ?NOTE: This medicine is only for you. Do not share this medicine with others. ?What if I miss a dose? ?If you miss a dose, take it as soon as you can. If it is almost time for your next dose, take only that dose. Do not take double or extra doses. ?What may interact with this medication? ?Clarithromycin ?Cyclosporine ?Diltiazem ?Itraconazole ?Simvastatin ?Tacrolimus ?This list may not describe all possible interactions. Give your health care provider a list of all the medicines, herbs, non-prescription drugs, or dietary supplements you use. Also tell them if you smoke, drink alcohol, or  use illegal drugs. Some items may interact with your medicine. ?What should I watch for while using this medication? ?Visit your health care provider for regular checks on your progress. Check your blood pressure as directed. Ask your health care provider what your blood pressure should be. Also, find out when you should contact him or her. ?Do not treat yourself for coughs, colds, or pain while you are using this medication without asking your health care provider for advice. Some medications may increase your blood pressure. ?You may get drowsy or dizzy. Do not drive, use machinery, or do anything that needs mental alertness until you know how this medication affects you. Do not stand up or sit up quickly, especially if you are an older patient. This reduces the risk of dizzy or fainting spells. Alcohol can make you more drowsy and dizzy. Avoid alcoholic drinks. ?What side effects may I notice from receiving this medication? ?Side effects that you should report to your care team as soon as possible: ?Allergic reactions--skin rash, itching, hives, swelling of the face, lips, tongue, or throat ?Heart attack--pain or tightness in the chest, shoulders, arms, or jaw, nausea, shortness of breath, cold or clammy skin, feeling faint or lightheaded ?Low blood pressure--dizziness, feeling faint or lightheaded, blurry vision ?Side effects that usually do not require medical attention (report these to your care team if they continue or are bothersome): ?Facial flushing, redness ?Heart palpitations--rapid, pounding, or irregular heartbeat ?Nausea ?Stomach pain ?Swelling of the ankles, hands, or feet ?This list may not describe all possible side effects. Call your doctor  for medical advice about side effects. You may report side effects to FDA at 1-800-FDA-1088. ?Where should I keep my medication? ?Keep out of the reach of children and pets. ?Store at room temperature between 20 and 25 degrees C (68 and 77 degrees F). Protect  from light and moisture. Keep the container tightly closed. Get rid of any unused medication after the expiration date. ?To get rid of medications that are no longer needed or have expired: ?Take the medication to a medication take-back program. Check with your pharmacy or law enforcement to find a location. ?If you cannot return the medication, check the label or package insert to see if the medication should be thrown out in the garbage or flushed down the toilet. If you are not sure, ask your health care provider. If it is safe to put in the trash, empty the medication out of the container. Mix the medication with cat litter, dirt, coffee grounds, or other unwanted substance. Seal the mixture in a bag or container. Put it in the trash. ?NOTE: This sheet is a summary. It may not cover all possible information. If you have questions about this medicine, talk to your doctor, pharmacist, or health care provider. ?? 2022 Elsevier/Gold Standard (2021-07-31 00:00:00) ? ?

## 2022-02-18 NOTE — Discharge Instructions (Signed)
Drink lots of fluids ?Take prednisone 2 times a day as directed.  Take 2 doses today ?Take the cough medicine as needed ?If you fail to improve over the next couple of days, or if you start feeling worse instead of better fill and take the antibiotic ?Follow-up with your primary care as needed ?

## 2022-02-19 ENCOUNTER — Telehealth: Payer: Self-pay

## 2022-02-19 DIAGNOSIS — R748 Abnormal levels of other serum enzymes: Secondary | ICD-10-CM | POA: Insufficient documentation

## 2022-02-19 LAB — COMPLETE METABOLIC PANEL WITH GFR
AG Ratio: 1 (calc) (ref 1.0–2.5)
ALT: 58 U/L — ABNORMAL HIGH (ref 6–29)
AST: 51 U/L — ABNORMAL HIGH (ref 10–35)
Albumin: 4.1 g/dL (ref 3.6–5.1)
Alkaline phosphatase (APISO): 74 U/L (ref 37–153)
BUN/Creatinine Ratio: 12 (calc) (ref 6–22)
BUN: 13 mg/dL (ref 7–25)
CO2: 24 mmol/L (ref 20–32)
Calcium: 9.2 mg/dL (ref 8.6–10.4)
Chloride: 102 mmol/L (ref 98–110)
Creat: 1.1 mg/dL — ABNORMAL HIGH (ref 0.50–1.03)
Globulin: 4.3 g/dL (calc) — ABNORMAL HIGH (ref 1.9–3.7)
Glucose, Bld: 94 mg/dL (ref 65–99)
Potassium: 4.1 mmol/L (ref 3.5–5.3)
Sodium: 138 mmol/L (ref 135–146)
Total Bilirubin: 0.4 mg/dL (ref 0.2–1.2)
Total Protein: 8.4 g/dL — ABNORMAL HIGH (ref 6.1–8.1)
eGFR: 60 mL/min/{1.73_m2} (ref >=60)

## 2022-02-19 NOTE — Progress Notes (Signed)
Liver enzymes remain elevated. Need to get ultrasound of liver. Ok to order once patient agrees.  ? ?Kidney function dropped a bit. Make sure staying hydrated and we need to work on consistent BP control. Keep 2 week follow up.

## 2022-02-20 NOTE — Telephone Encounter (Signed)
Erroneous error

## 2022-02-21 ENCOUNTER — Other Ambulatory Visit: Payer: Self-pay | Admitting: Neurology

## 2022-02-21 DIAGNOSIS — I10 Essential (primary) hypertension: Secondary | ICD-10-CM

## 2022-02-21 DIAGNOSIS — R748 Abnormal levels of other serum enzymes: Secondary | ICD-10-CM

## 2022-02-25 DIAGNOSIS — Z1211 Encounter for screening for malignant neoplasm of colon: Secondary | ICD-10-CM | POA: Diagnosis not present

## 2022-02-25 NOTE — Addendum Note (Signed)
Addended by: Donella Stade on: 02/25/2022 06:06 AM ? ? Modules accepted: Orders ? ?

## 2022-03-04 ENCOUNTER — Ambulatory Visit (INDEPENDENT_AMBULATORY_CARE_PROVIDER_SITE_OTHER): Payer: BC Managed Care – PPO | Admitting: Family Medicine

## 2022-03-04 ENCOUNTER — Ambulatory Visit (HOSPITAL_BASED_OUTPATIENT_CLINIC_OR_DEPARTMENT_OTHER): Payer: BC Managed Care – PPO

## 2022-03-04 VITALS — BP 129/84 | HR 88

## 2022-03-04 DIAGNOSIS — I10 Essential (primary) hypertension: Secondary | ICD-10-CM | POA: Diagnosis not present

## 2022-03-04 NOTE — Progress Notes (Signed)
Agree with documentation as above.   Erubiel Manasco, MD  

## 2022-03-04 NOTE — Progress Notes (Signed)
Patient comes in today for blood pressure check.  ? ?Kristine Arnold is taking Metoprolol 25 mg one tablet twice daily and Telmisartan-HCTZ 80-25 mg one tablet daily for blood pressure control. Kristine Arnold denies any missed doses, side effects, headaches, chest pain, palpitations, dizziness, or shortness of breath.  ? ?Amlodipine 2.5 mg was added last visit, but patient called back after the visit and realized she forgot to take her blood pressure medication on the day of her last appointment. She wanted to come in on her current medications to decide whether this new medication needed to be added.  ? ?Kristine Arnold hasn't been checking blood pressure readings at home. ? ?Her first blood pressure reading today is: 130/89. ?After sitting, her second reading is: 129/84.  ? ?I spoke with Dr. Linford Arnold who advised for patient to start Amlodipine 2.5 mg daily and return in two weeks for repeat blood pressure check.  ? ?

## 2022-03-05 ENCOUNTER — Ambulatory Visit (INDEPENDENT_AMBULATORY_CARE_PROVIDER_SITE_OTHER): Payer: BC Managed Care – PPO

## 2022-03-05 DIAGNOSIS — N200 Calculus of kidney: Secondary | ICD-10-CM | POA: Diagnosis not present

## 2022-03-05 DIAGNOSIS — K7689 Other specified diseases of liver: Secondary | ICD-10-CM | POA: Diagnosis not present

## 2022-03-05 DIAGNOSIS — R748 Abnormal levels of other serum enzymes: Secondary | ICD-10-CM

## 2022-03-05 DIAGNOSIS — K8020 Calculus of gallbladder without cholecystitis without obstruction: Secondary | ICD-10-CM | POA: Diagnosis not present

## 2022-03-06 ENCOUNTER — Other Ambulatory Visit: Payer: Self-pay | Admitting: Physician Assistant

## 2022-03-06 DIAGNOSIS — R160 Hepatomegaly, not elsewhere classified: Secondary | ICD-10-CM | POA: Insufficient documentation

## 2022-03-06 DIAGNOSIS — N281 Cyst of kidney, acquired: Secondary | ICD-10-CM | POA: Insufficient documentation

## 2022-03-06 DIAGNOSIS — N2889 Other specified disorders of kidney and ureter: Secondary | ICD-10-CM

## 2022-03-06 LAB — COLOGUARD: COLOGUARD: NEGATIVE

## 2022-03-06 NOTE — Progress Notes (Signed)
Normal cologuard. Repeat in 3 years.

## 2022-03-06 NOTE — Progress Notes (Signed)
[name],  ? ?You have a mass on your liver and mass on right kidney both that need to be better looked at. I am ordering a MRI to get a better look.

## 2022-03-18 ENCOUNTER — Ambulatory Visit (INDEPENDENT_AMBULATORY_CARE_PROVIDER_SITE_OTHER): Payer: BC Managed Care – PPO | Admitting: Physician Assistant

## 2022-03-18 VITALS — BP 125/86 | HR 77 | Ht 66.0 in | Wt 177.0 lb

## 2022-03-18 DIAGNOSIS — I10 Essential (primary) hypertension: Secondary | ICD-10-CM | POA: Insufficient documentation

## 2022-03-18 NOTE — Progress Notes (Signed)
BP looks great. Continue same medications and keep regular follow up.  ?

## 2022-03-18 NOTE — Progress Notes (Signed)
Patient is here for blood pressure check.  ? ?Previous BP was 158/99 ? ?1st BP today: 125/87 ? ?2nd BP today (after 10  minutes): 125/86 ? ?Pt taking Amlodipine 2.5mg , Metoprolol 25mg , and Telmisartan-HCTZ 80-25mg .  ? ?Denies chest pain, dizziness, shortness of breath, severe headache, or nosebleeds. Taking medication as prescribed. Denies missed doses. Pt states she doesn't drink a lot of water. Advised to increase water intake. ? ? ?

## 2022-03-26 ENCOUNTER — Other Ambulatory Visit: Payer: Self-pay | Admitting: Physician Assistant

## 2022-03-26 DIAGNOSIS — I10 Essential (primary) hypertension: Secondary | ICD-10-CM

## 2022-04-09 ENCOUNTER — Other Ambulatory Visit: Payer: BC Managed Care – PPO

## 2022-04-15 ENCOUNTER — Other Ambulatory Visit: Payer: BC Managed Care – PPO

## 2022-04-15 ENCOUNTER — Ambulatory Visit: Payer: BC Managed Care – PPO

## 2022-04-15 ENCOUNTER — Ambulatory Visit
Admission: RE | Admit: 2022-04-15 | Discharge: 2022-04-15 | Disposition: A | Discharge status: Home / Self Care | Payer: BC Managed Care – PPO | Source: Ambulatory Visit | Attending: Osteopathic Medicine | Admitting: Osteopathic Medicine

## 2022-04-15 DIAGNOSIS — K839 Disease of biliary tract, unspecified: Secondary | ICD-10-CM | POA: Diagnosis not present

## 2022-04-15 DIAGNOSIS — K8020 Calculus of gallbladder without cholecystitis without obstruction: Secondary | ICD-10-CM | POA: Diagnosis not present

## 2022-04-15 DIAGNOSIS — N63 Unspecified lump in unspecified breast: Secondary | ICD-10-CM

## 2022-04-15 DIAGNOSIS — N6321 Unspecified lump in the left breast, upper outer quadrant: Secondary | ICD-10-CM | POA: Diagnosis not present

## 2022-04-15 DIAGNOSIS — N6322 Unspecified lump in the left breast, upper inner quadrant: Secondary | ICD-10-CM | POA: Diagnosis not present

## 2022-04-15 DIAGNOSIS — N200 Calculus of kidney: Secondary | ICD-10-CM | POA: Diagnosis not present

## 2022-04-15 DIAGNOSIS — R922 Inconclusive mammogram: Secondary | ICD-10-CM | POA: Diagnosis not present

## 2022-04-15 DIAGNOSIS — N281 Cyst of kidney, acquired: Secondary | ICD-10-CM | POA: Diagnosis not present

## 2022-04-15 DIAGNOSIS — R160 Hepatomegaly, not elsewhere classified: Secondary | ICD-10-CM

## 2022-04-15 DIAGNOSIS — N2889 Other specified disorders of kidney and ureter: Secondary | ICD-10-CM

## 2022-04-15 MED ORDER — GADOBUTROL 1 MMOL/ML IV SOLN
8.0000 mL | Freq: Once | INTRAVENOUS | Status: AC | PRN
  Administered 2022-04-15: 8 mL via INTRAVENOUS

## 2022-04-16 ENCOUNTER — Encounter: Payer: Self-pay | Admitting: Physician Assistant

## 2022-04-16 DIAGNOSIS — K760 Fatty (change of) liver, not elsewhere classified: Secondary | ICD-10-CM | POA: Insufficient documentation

## 2022-04-16 DIAGNOSIS — N200 Calculus of kidney: Secondary | ICD-10-CM | POA: Insufficient documentation

## 2022-04-16 DIAGNOSIS — K8020 Calculus of gallbladder without cholecystitis without obstruction: Secondary | ICD-10-CM | POA: Insufficient documentation

## 2022-04-16 NOTE — Progress Notes (Signed)
Evidence of fatty liver. Working on weight loss with diet and exercise is the best way to treat this and to decrease liver enzymes. Recheck enzymes in 3 months.  Benign cyst of right kidney with no need for follow up.  Left kidney stone Gallstones noted but no evidence of any acute process The right liver lesion is suspected to be benign. Follow up with MRI with contrast in 6 months.

## 2022-05-27 ENCOUNTER — Telehealth: Payer: Self-pay | Admitting: Physician Assistant

## 2022-05-27 NOTE — Telephone Encounter (Signed)
Patient actually meant Amlodipine but she stated she is okay until her appointment. Thank you.

## 2022-05-27 NOTE — Telephone Encounter (Signed)
Patient has a f/u appointment with PCP on 06/10/22. She stated she is not completely out of medication below, but she may not have enough to last until her appointment date and didn't know if she could have a small refill until 7/17?   telmisartan-hydrochlorothiazide (MICARDIS HCT) 80-25 MG tablet  [id number] - [place], [place] - [address] Phone:  [phone]  Fax:  [phone]

## 2022-05-27 NOTE — Telephone Encounter (Signed)
It was sent in March for a year. She should have plenty of refills, just needs to call the pharmacy. Thanks!

## 2022-06-10 ENCOUNTER — Ambulatory Visit: Payer: BC Managed Care – PPO | Admitting: Physician Assistant

## 2022-06-18 ENCOUNTER — Other Ambulatory Visit: Payer: Self-pay | Admitting: Physician Assistant

## 2022-06-18 DIAGNOSIS — I10 Essential (primary) hypertension: Secondary | ICD-10-CM

## 2022-07-02 ENCOUNTER — Ambulatory Visit (INDEPENDENT_AMBULATORY_CARE_PROVIDER_SITE_OTHER): Payer: BC Managed Care – PPO | Admitting: Physician Assistant

## 2022-07-02 ENCOUNTER — Encounter: Payer: Self-pay | Admitting: Physician Assistant

## 2022-07-02 VITALS — BP 132/82 | HR 90 | Ht 66.0 in | Wt 180.0 lb

## 2022-07-02 DIAGNOSIS — K760 Fatty (change of) liver, not elsewhere classified: Secondary | ICD-10-CM

## 2022-07-02 DIAGNOSIS — I10 Essential (primary) hypertension: Secondary | ICD-10-CM | POA: Diagnosis not present

## 2022-07-02 DIAGNOSIS — R748 Abnormal levels of other serum enzymes: Secondary | ICD-10-CM

## 2022-07-02 MED ORDER — AMLODIPINE BESYLATE 2.5 MG PO TABS: 2.5000 mg | ORAL_TABLET | Freq: Every day | ORAL | 1 refills | Status: DC

## 2022-07-02 NOTE — Progress Notes (Signed)
   Established Patient Office Visit  Subjective   Patient ID: Kristine Arnold, female    DOB: [birth year]  Age: 51 y.o. MRN: [id number]  Chief Complaint  Patient presents with   Hypertension   Follow-up    HPI Pt is a 51 yo overweight female with HTN, elevated liver enzymes, fatty liver who presents to the clinic for medication refills.   She denies any concerns. She is taking her medications daily. She is not checking her BP. She denies any GI issues. She is having 1 glass of wine at night.    Review of Systems  All other systems reviewed and are negative.     Objective:     BP (!) 136/92   Pulse 90   Ht 5\' 6"  (1.676 m)   Wt 180 lb (81.6 kg)   SpO2 99%   BMI 29.05 kg/m  BP Readings from Last 3 Encounters:  07/02/22 (!) 136/92  03/18/22 125/86  03/04/22 129/84   Wt Readings from Last 3 Encounters:  07/02/22 180 lb (81.6 kg)  03/18/22 177 lb (80.3 kg)  02/18/22 175 lb (79.4 kg)      Physical Exam Constitutional:      Appearance: Normal appearance.  HENT:     Head: Normocephalic.  Cardiovascular:     Rate and Rhythm: Normal rate and regular rhythm.     Pulses: Normal pulses.  Pulmonary:     Effort: Pulmonary effort is normal.     Breath sounds: Normal breath sounds.  Neurological:     General: No focal deficit present.     Mental Status: She is alert and oriented to person, place, and time.  Psychiatric:        Mood and Affect: Mood normal.          Assessment & Plan:   03/29/23Marland KitchenTenaya was seen today for hypertension and follow-up.  Diagnoses and all orders for this visit:  Essential hypertension -     COMPLETE METABOLIC PANEL WITH GFR -     amLODipine (NORVASC) 2.5 MG tablet; Take 1 tablet (2.5 mg total) by mouth daily.  Elevated liver enzymes -     COMPLETE METABOLIC PANEL WITH GFR  Hepatic steatosis -     COMPLETE METABOLIC PANEL WITH GFR   Cmp to recheck kidney and liver function Refilled medications BP to goal Discussed importance of  limiting alcohol intake and regular exercise Follow up in 6 months  Janalyn Shy, PA-C

## 2022-07-03 LAB — COMPLETE METABOLIC PANEL WITH GFR
AG Ratio: 1.1 (calc) (ref 1.0–2.5)
ALT: 56 U/L — ABNORMAL HIGH (ref 6–29)
AST: 53 U/L — ABNORMAL HIGH (ref 10–35)
Albumin: 4.1 g/dL (ref 3.6–5.1)
Alkaline phosphatase (APISO): 74 U/L (ref 37–153)
BUN: 17 mg/dL (ref 7–25)
CO2: 27 mmol/L (ref 20–32)
Calcium: 10.8 mg/dL — ABNORMAL HIGH (ref 8.6–10.4)
Chloride: 100 mmol/L (ref 98–110)
Creat: 0.99 mg/dL (ref 0.50–1.03)
Globulin: 3.9 g/dL (calc) — ABNORMAL HIGH (ref 1.9–3.7)
Glucose, Bld: 95 mg/dL (ref 65–99)
Potassium: 3.8 mmol/L (ref 3.5–5.3)
Sodium: 139 mmol/L (ref 135–146)
Total Bilirubin: 0.4 mg/dL (ref 0.2–1.2)
Total Protein: 8 g/dL (ref 6.1–8.1)
eGFR: 69 mL/min/{1.73_m2} (ref >=60)

## 2022-07-03 NOTE — Progress Notes (Signed)
Nasra,   Liver enzymes are stable.  Kidney function improved.  Calcium up a little from normal. Make sure taking vitamin D at least 1000 units daily.   Recheck CMP in 3 months.

## 2022-07-17 ENCOUNTER — Telehealth: Payer: Self-pay

## 2022-07-17 DIAGNOSIS — Z111 Encounter for screening for respiratory tuberculosis: Secondary | ICD-10-CM

## 2022-07-17 NOTE — Telephone Encounter (Signed)
Pt came in with an e-mail stating that she has to have a [facility] to completed. She wants you to put this lab in for her as soon as possible and let her know when she can come complete it. [phone]

## 2022-07-22 NOTE — Telephone Encounter (Signed)
Patient made aware, she states she already went elsewhere and had testing done. Will call back if needed.

## 2022-08-02 IMAGING — MR MR ABDOMEN WO/W CM
18 of 19 series · 45 of 48 positions shown · IV contrast (GADAVIST)
Comparison: Ultrasound 03/05/2022

CLINICAL DATA: Indeterminate lesion in the RIGHT kidney and liver
on ultrasound. MRI recommended for further evaluation.

EXAM:
MRI ABDOMEN WITHOUT AND WITH CONTRAST
TECHNIQUE: Multiplanar multisequence MR imaging of the abdomen was performed
both before and after the administration of intravenous contrast.
CONTRAST:  8mL GADAVIST GADOBUTROL 1 MMOL/ML IV SOLN

[Series 3: cor ssfse / · coronal · 7.0mm · 1.48mm/px · 1 of 35 slices shown]
[im 1/35]
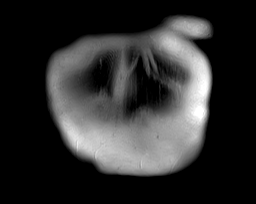

[Series 4: T2 fat-sat · axial · 7.0mm · 1.48mm/px · 1 of 32 slices shown]
[im 1/32]
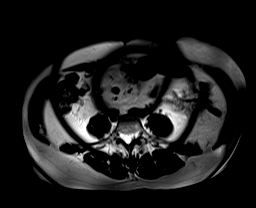

[Series 5: T1 · axial · 7.0mm · 0.74mm/px · z∈[-120,+141]mm · 2 of 64 slices shown]
[im 1/64]
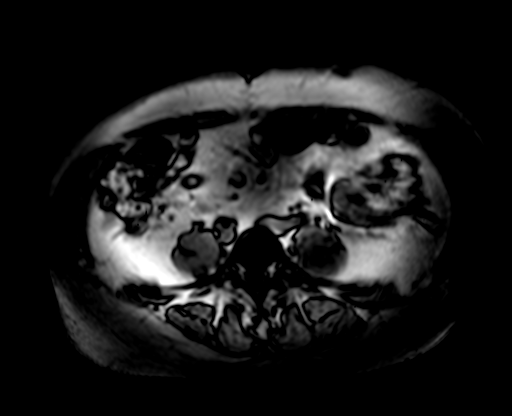
[im 64/64]
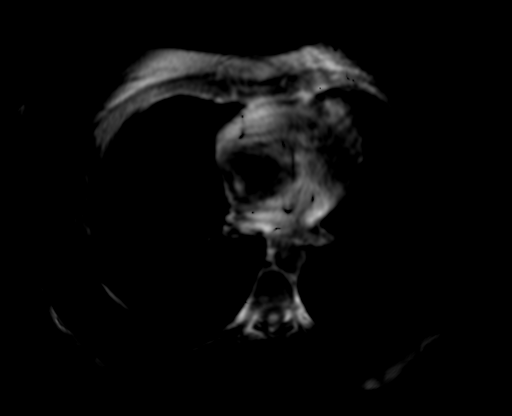

[Series 7: bSSFP · axial · 7.0mm · 0.74mm/px · 1 of 32 slices shown]
[im 1/32]
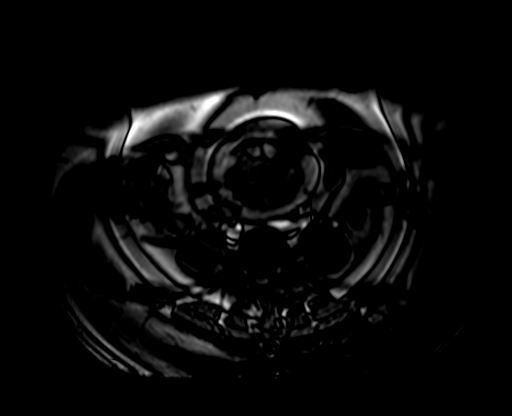

[Series 8: DWI · axial · 7.0mm · 1.98mm/px · z∈[-120,+190]mm · 5 of 114 slices shown]
[im 1/114]
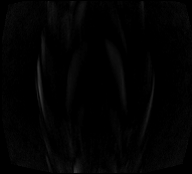
[im 29/114]
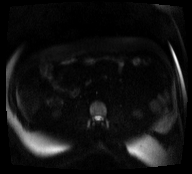
[im 57/114]
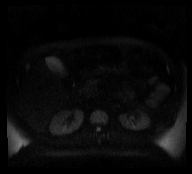
[im 85/114]
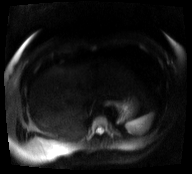
[im 114/114]
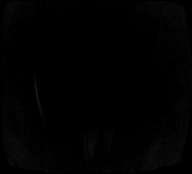

[Series 9: axial dwi_adc · axial · 7.0mm · 1.98mm/px · z∈[-120,+190]mm · 2 of 38 slices shown]
[im 1/38]
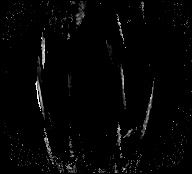
[im 38/38]
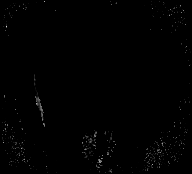

[Series 10: axial ssfse / · axial · 7.0mm · 1.19mm/px · 1 of 34 slices shown]
[im 1/34]
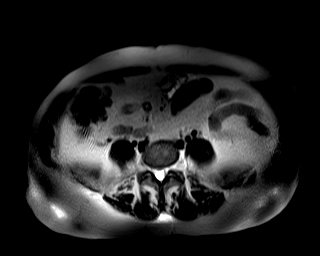

[Series 11: axial dynamic pre · axial · non-contrast · 4.0mm · 1.48mm/px · z∈[-119,+133]mm · 3 of 64 slices shown]
[im 1/64]
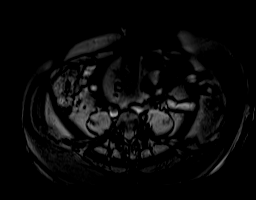
[im 32/64]
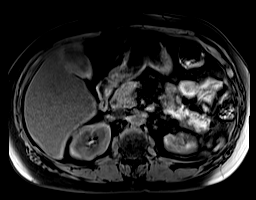
[im 64/64]
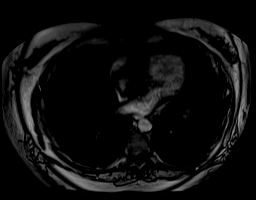

[Series 13: T2 · coronal · 1.5mm · 0.94mm/px · 4 of 88 slices shown]
[im 1/88]
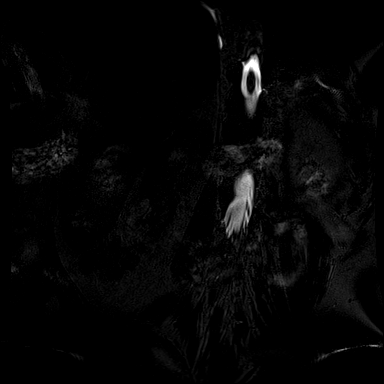
[im 30/88]
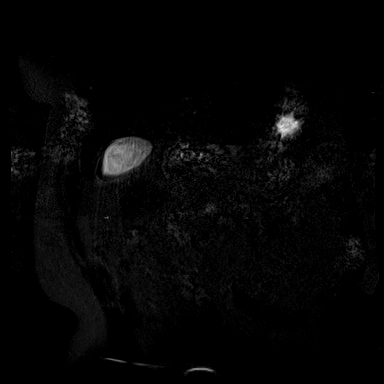
[im 59/88]
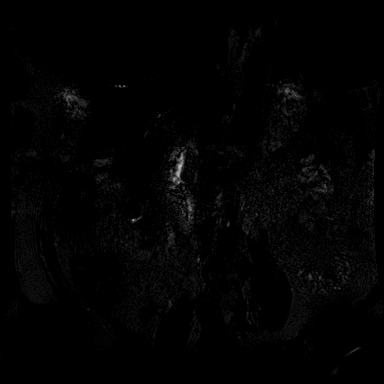
[im 88/88]
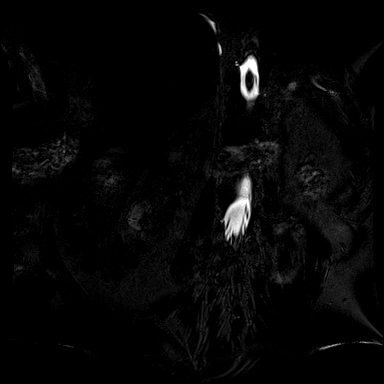

[Series 15: axial dynamic post · axial · 4.0mm · 1.48mm/px · z∈[-119,+133]mm · 3 of 64 slices shown (1 of 6)]
[im 1/64]
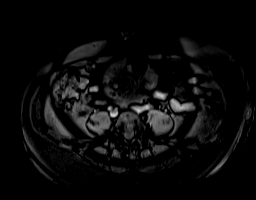
[im 32/64]
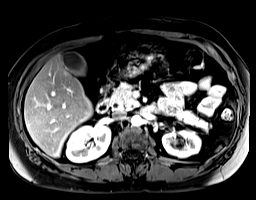
[im 64/64]
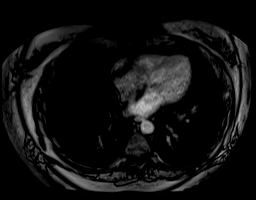

[Series 16: axial dynamic post · axial · 4.0mm · 1.48mm/px · z∈[-119,+133]mm · 3 of 64 slices shown (2 of 6)]
[im 1/64]
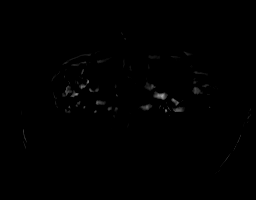
[im 32/64]
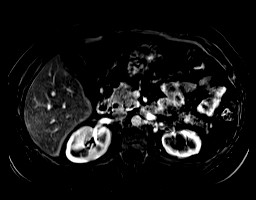
[im 64/64]
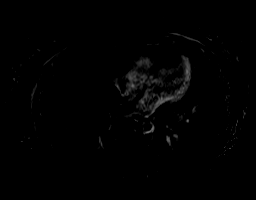

[Series 17: axial dynamic post · axial · 4.0mm · 1.48mm/px · z∈[-119,+133]mm · 3 of 64 slices shown (3 of 6)]
[im 1/64]
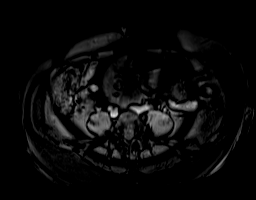
[im 32/64]
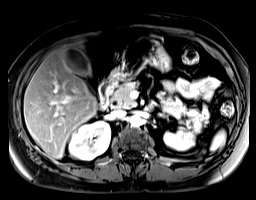
[im 64/64]
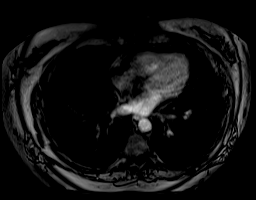

[Series 18: axial dynamic post · axial · 4.0mm · 1.48mm/px · z∈[-119,+133]mm · 3 of 64 slices shown (4 of 6)]
[im 1/64]
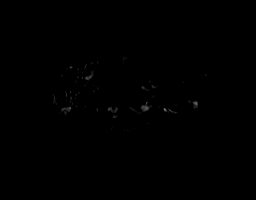
[im 32/64]
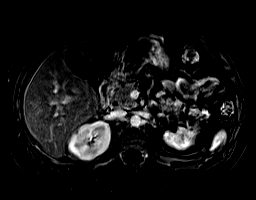
[im 64/64]
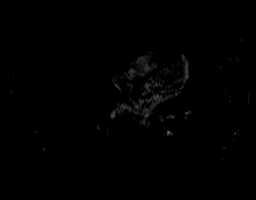

[Series 19: axial dynamic post · axial · 4.0mm · 1.48mm/px · z∈[-119,+133]mm · 3 of 64 slices shown (5 of 6)]
[im 1/64]
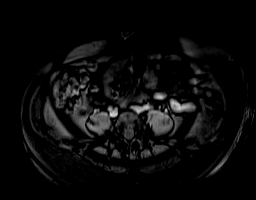
[im 32/64]
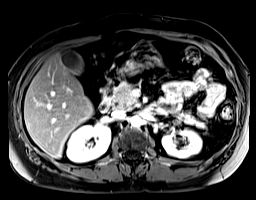
[im 64/64]
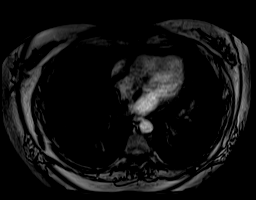

[Series 20: axial dynamic post · axial · 4.0mm · 1.48mm/px · z∈[-119,+133]mm · 3 of 64 slices shown (6 of 6)]
[im 1/64]
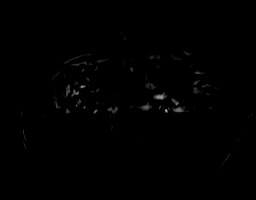
[im 32/64]
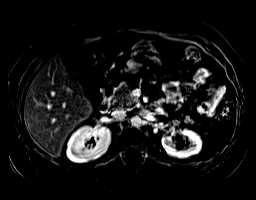
[im 64/64]
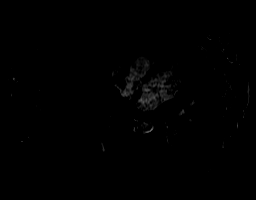

[Series 22: axial dynamic 3 · axial · 4.0mm · 1.48mm/px · z∈[-119,+133]mm · 3 of 64 slices shown (1 of 2)]
[im 1/64]
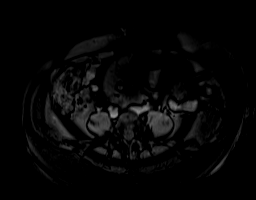
[im 32/64]
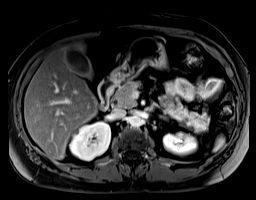
[im 64/64]
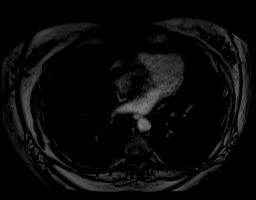

[Series 23: axial dynamic 3 · axial · 4.0mm · 1.48mm/px · z∈[-119,+133]mm · 3 of 64 slices shown (2 of 2)]
[im 1/64]
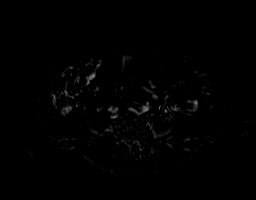
[im 32/64]
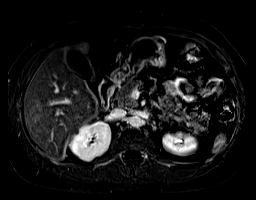
[im 64/64]
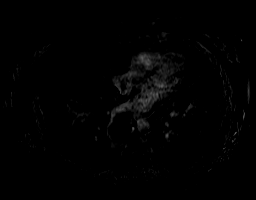

[Series 100: spin · sagittal · 0.94mm/px · 1 of 8 slices shown]
[im 1/8]
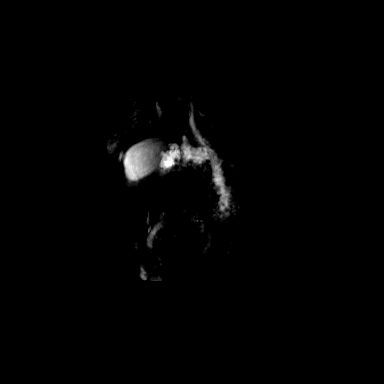

[45 of 48 positions shown; findings below may reference images not displayed]

FINDINGS: Lower chest:  Lung bases are clear.

Hepatobiliary: There is significant loss of signal intensity on
opposed phase imaging throughout the liver consistent with hepatic
steatosis.

There is early enhancing lesion in the RIGHT hepatic lobe measuring
11 mm on image 20/series 15. This lesion has persistent enhancement
on the more delayed series (series 19). Lesion difficult define on
the precontrast T1 and T2 weighted imaging.

Gallstone noted within lumen the gallbladder. No biliary duct
dilatation.

Pancreas: Normal pancreatic parenchymal intensity. No ductal
dilatation or inflammation.

Spleen: Normal spleen.

Adrenals/urinary tract: Adrenal glands normal. There is nonenhancing
irregular cyst extending from the anterior margin the RIGHT kidney
measuring 26 by 19 mm (image [DATE]). This irregular cyst has no
post-contrast enhancement or internal complexity (series 20)

There is a large stone within the mid LEFT kidney with mild
pelvicaliectasis

Stomach/Bowel: Stomach and limited of the small bowel is
unremarkable

Vascular/Lymphatic: Abdominal aortic normal caliber. No
retroperitoneal periportal lymphadenopathy.

Musculoskeletal: No aggressive osseous lesion
IMPRESSION: 1. Enhancing lesion in the RIGHT hepatic lobe is indeterminate but
favored benign. Consider follow-up MRI contrast in 6-12 months.
2. Significant hepatic steatosis.
3. Benign nonenhancing cyst of the RIGHT kidney (Bosniak 2). No
specific follow-up recommended.
4. Calculus within the LEFT kidney.
5. Gallstone noted.  No evidence acute cholecystitis.

## 2022-08-02 IMAGING — MG DIGITAL DIAGNOSTIC BILAT W/ TOMO W/ CAD
8 series · 8 of 24 positions shown · non-contrast
Comparison: Previous exam(s).

CLINICAL DATA: 50-year-old female presenting for annual bilateral
mammogram and 1 year follow-up of a probably benign left breast
mass.

EXAM:
DIGITAL DIAGNOSTIC BILATERAL MAMMOGRAM WITH TOMOSYNTHESIS AND CAD;
ULTRASOUND LEFT BREAST LIMITED
TECHNIQUE: Bilateral digital diagnostic mammography and breast tomosynthesis
was performed. The images were evaluated with computer-aided
detection.; Targeted ultrasound examination of the left breast was
performed.

[R MLO synth-2D]
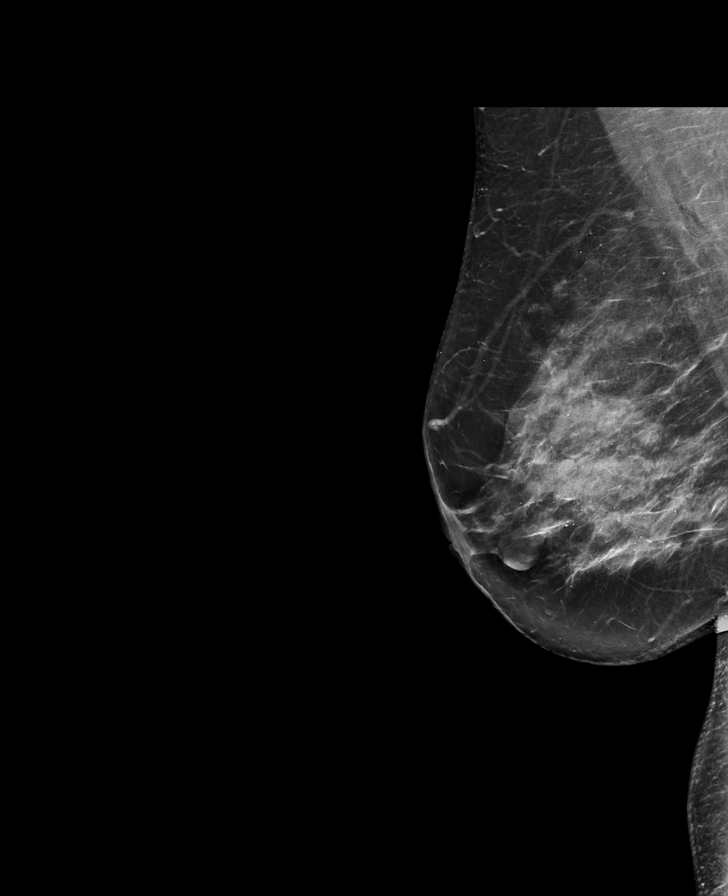

[L MLO synth-2D]
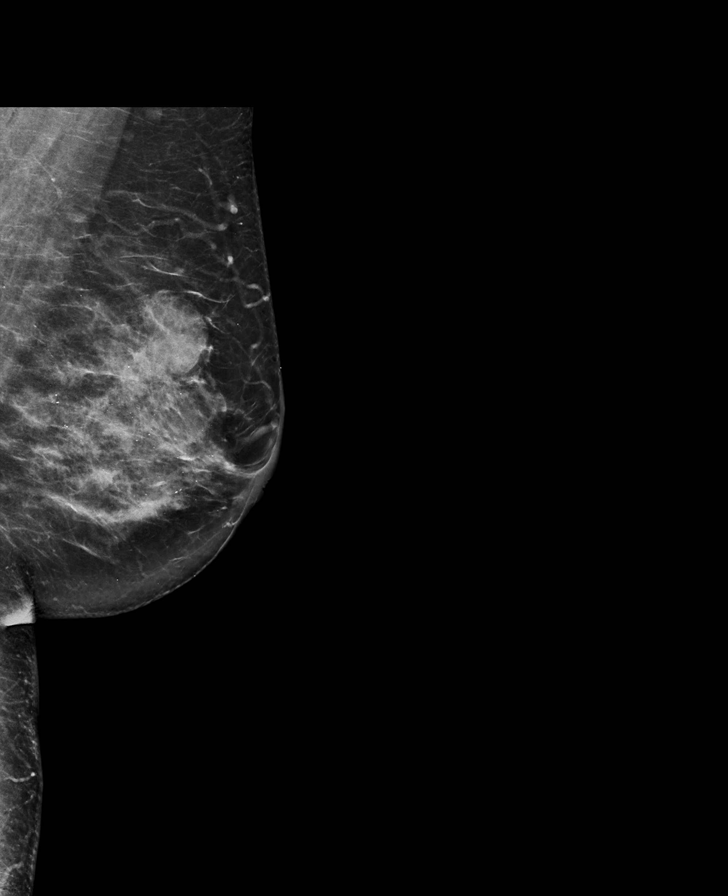

[L CC synth-2D]
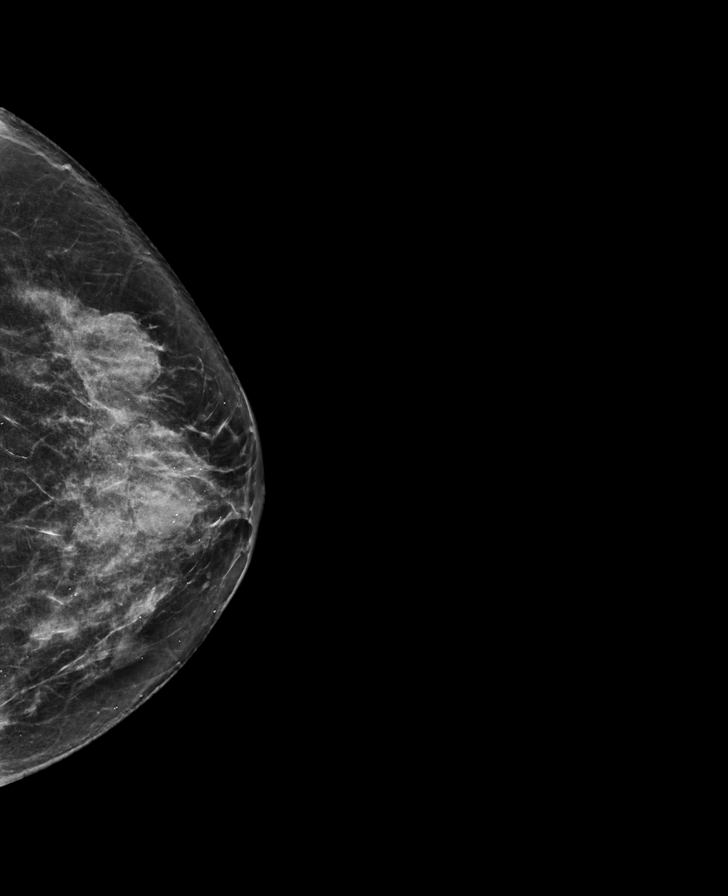

[R CC synth-2D]
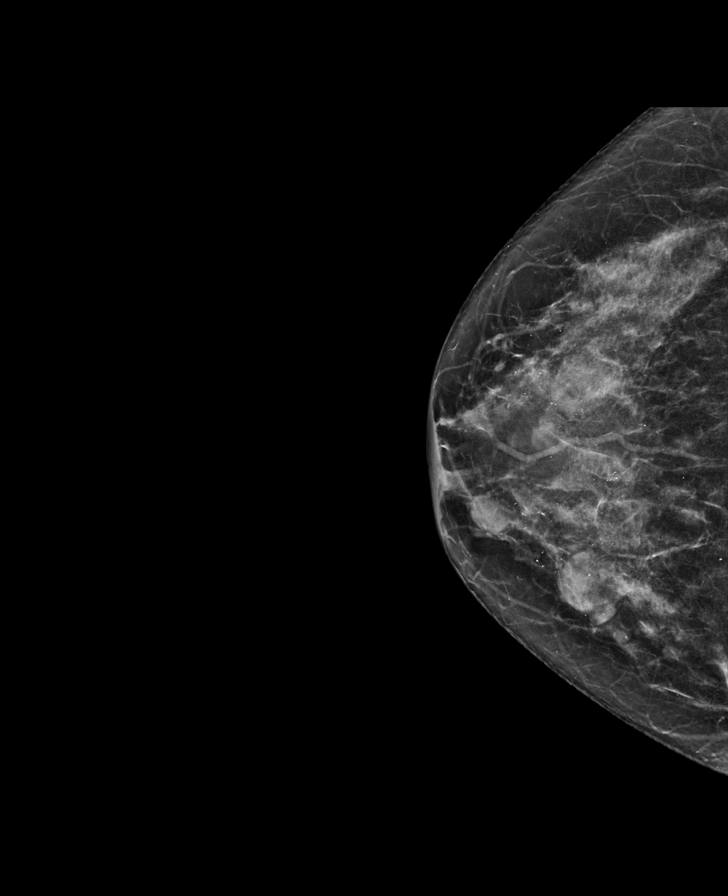

[L MLO tomo · tomo slice 41/80.0]
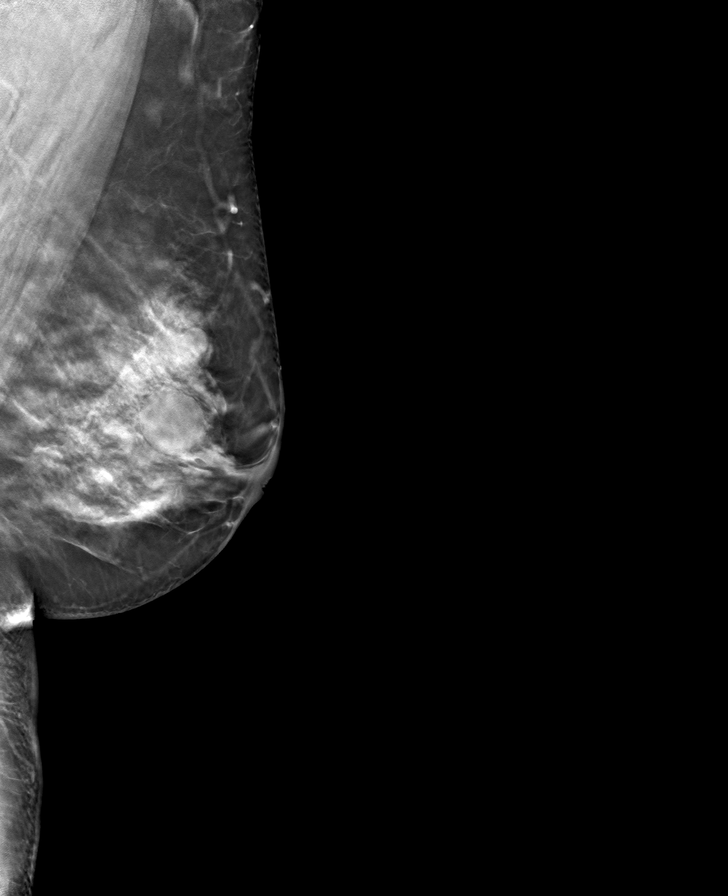

[R MLO tomo · tomo slice 43/84.0]
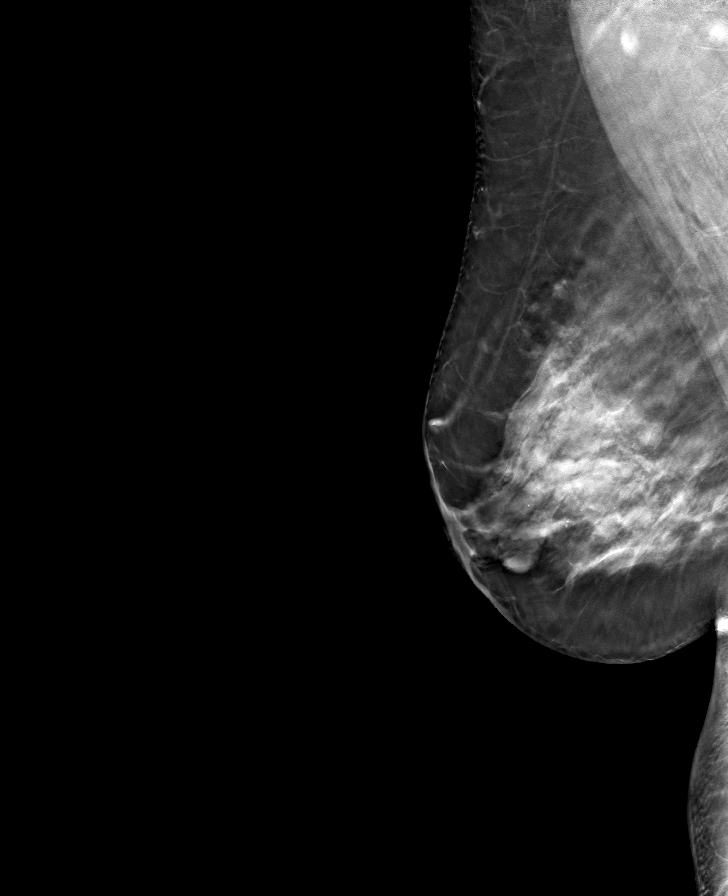

[L CC tomo · tomo slice 35/69.0]
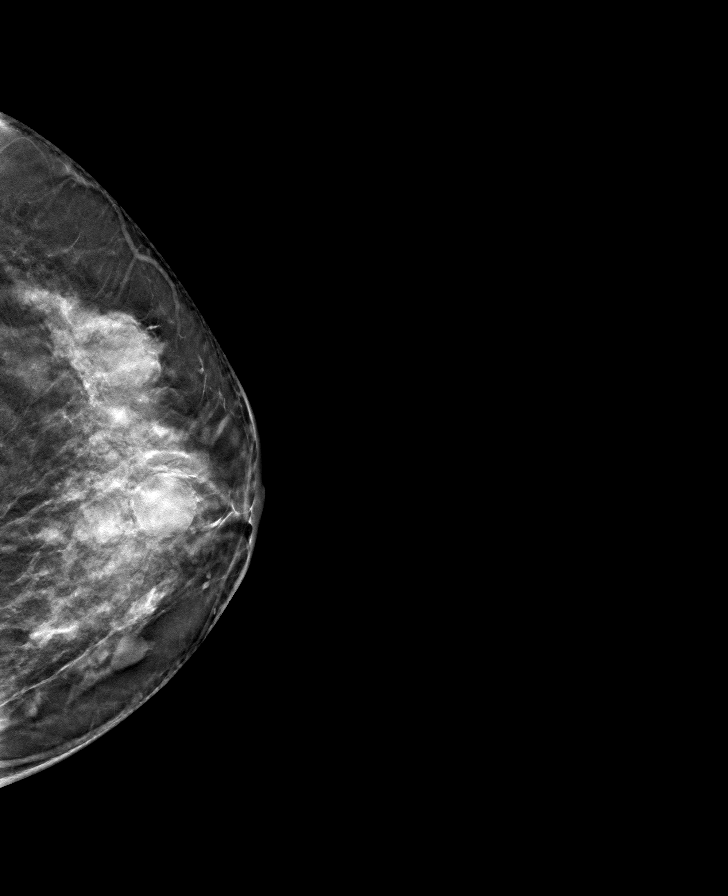

[R CC tomo · tomo slice 33/66.0]
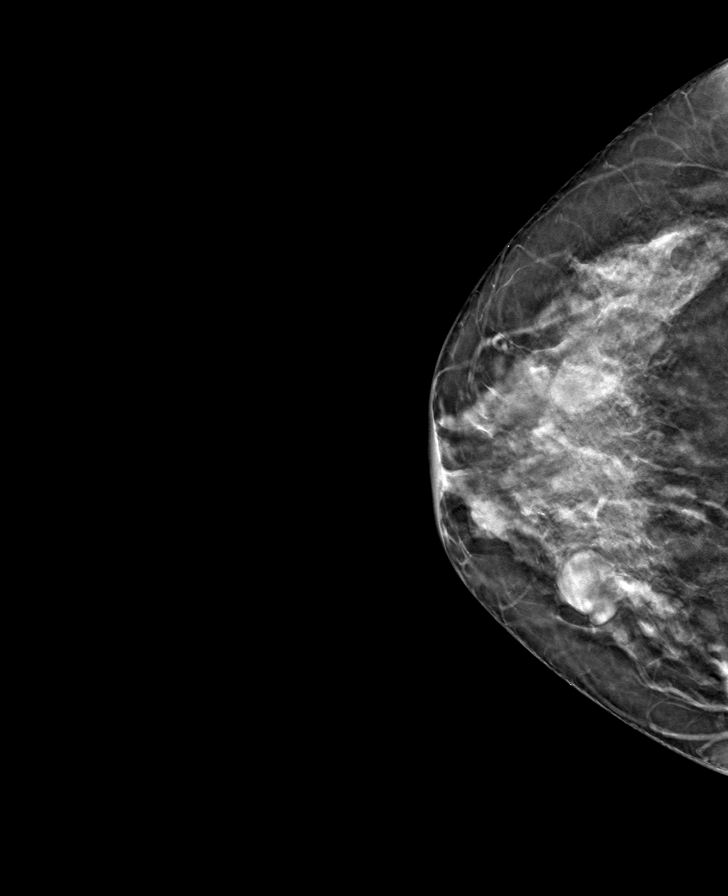

[8 of 24 positions shown; findings below may reference images not displayed]

ACR Breast Density Category c: The breast tissue is heterogeneously
dense, which may obscure small masses.
FINDINGS: Waxing and waning circumscribed equal density masses are consistent
with a changing cystic pattern. Otherwise, no suspicious findings
identified in either breast.

Targeted ultrasound is performed, showing stable appearance of a
circumscribed hypoechoic mass at the 12 o'clock retroareolar
position. It measures 1.3 x 1.1 x 0.9 cm (previously 1.1 x 1.1 x
cm). Slight variations are due to differences in positioning and
technique.
IMPRESSION: 1. Stable, probably benign left breast mass. Recommend a final
follow-up in 1 year.
2. No mammographic evidence of malignancy on the right.

RECOMMENDATION:
Bilateral diagnostic mammogram and left breast ultrasound in 1 year.

I have discussed the findings and recommendations with the patient.
If applicable, a reminder letter will be sent to the patient
regarding the next appointment.

BI-RADS CATEGORY  3: Probably benign.

## 2022-10-08 ENCOUNTER — Other Ambulatory Visit: Payer: Self-pay

## 2022-10-08 ENCOUNTER — Ambulatory Visit
Admission: EM | Admit: 2022-10-08 | Discharge: 2022-10-08 | Disposition: A | Discharge status: Home / Self Care | Payer: BC Managed Care – PPO | Attending: Family Medicine | Admitting: Family Medicine

## 2022-10-08 ENCOUNTER — Encounter: Payer: Self-pay | Admitting: Emergency Medicine

## 2022-10-08 DIAGNOSIS — H6011 Cellulitis of right external ear: Secondary | ICD-10-CM

## 2022-10-08 MED ORDER — DOXYCYCLINE HYCLATE 100 MG PO CAPS: 100.0000 mg | ORAL_CAPSULE | Freq: Two times a day (BID) | ORAL | 0 refills | Status: AC

## 2022-10-08 NOTE — Discharge Instructions (Addendum)
Advised patient to take medication as directed with food to completion.  Encouraged patient increase daily water intake to 64 ounces per day while taking this medication.  Advised if symptoms worsen and/or unresolved please follow-up with PCP or here for further evaluation. 

## 2022-10-08 NOTE — ED Provider Notes (Signed)
[facility]    CSN: [id number] Arrival date & time: 10/08/22  1225      History   Chief Complaint No chief complaint on file.   HPI Kristine Arnold is a 51 y.o. female.   HPI 51 year old female presents with right earlobe infection that she noticed earlier today.  Patient reports that ear was pits pierced in February 2023 and annular ring was taken out last night as area of right ear looked irritated.  PMH significant for hepatic steatosis, HTN and elevated liver enzymes.  Past Medical History:  Diagnosis Date   Hypertension    Joint pain     Patient Active Problem List   Diagnosis Date Noted   Hepatic steatosis 04/16/2022   Gallstones 04/16/2022   Kidney stone on left side 04/16/2022   Essential hypertension 03/18/2022   Benign cyst of right kidney 03/06/2022   Liver mass, right lobe 03/06/2022   Elevated liver enzymes 02/19/2022   Benign breast cyst in female, left 10/17/2021   Uncontrolled hypertension 10/09/2021   Abnormal uterine bleeding due to endometrial polyp 03/08/2021    Past Surgical History:  Procedure Laterality Date   TUBAL LIGATION      OB History     Gravida  4   Para  4   Term  4   Preterm      AB      Living  4      SAB      IAB      Ectopic      Multiple      Live Births               Home Medications    Prior to Admission medications   Medication Sig Start Date End Date Taking? Authorizing Provider  doxycycline (VIBRAMYCIN) 100 MG capsule Take 1 capsule (100 mg total) by mouth 2 (two) times daily for 7 days. 10/08/22 10/15/22 Yes Eliezer Lofts, FNP  amLODipine (NORVASC) 2.5 MG tablet Take 1 tablet (2.5 mg total) by mouth daily. 07/02/22   Breeback, Luvenia Starch L, PA-C  metoprolol tartrate (LOPRESSOR) 25 MG tablet Take 1 tablet (25 mg total) by mouth 2 (two) times daily. 10/09/21   Breeback, Royetta Car, PA-C  telmisartan-hydrochlorothiazide (MICARDIS HCT) 80-25 MG tablet Take 1 tablet by mouth daily. 02/18/22    Donella Stade, PA-C    Family History Family History  Problem Relation Age of Onset   Heart disease Mother     Social History Social History   Tobacco Use   Smoking status: Never   Smokeless tobacco: Never  Vaping Use   Vaping Use: Never used  Substance Use Topics   Alcohol use: Not Currently   Drug use: Never     Allergies   Patient has no known allergies.   Review of Systems Review of Systems  Skin:  Positive for rash.     Physical Exam Triage Vital Signs ED Triage Vitals  Enc Vitals Group     BP 10/08/22 1249 112/77     Pulse Rate 10/08/22 1249 85     Resp 10/08/22 1249 16     Temp 10/08/22 1249 98.5 F (36.9 C)     Temp Source 10/08/22 1249 Oral     SpO2 10/08/22 1249 97 %     Weight 10/08/22 1250 178 lb (80.7 kg)     Height 10/08/22 1250 5\' 5"  (1.651 m)     Head Circumference --      Peak Flow --  Pain Score 10/08/22 1250 1     Pain Loc --      Pain Edu? --      Excl. in [place]? --    No data found.  Updated Vital Signs BP 112/77 (BP Location: Left Arm)   Pulse 85   Temp 98.5 F (36.9 C) (Oral)   Resp 16   Ht 5\' 5"  (1.651 m)   Wt 178 lb (80.7 kg)   SpO2 97%   BMI 29.62 kg/m    Physical Exam Vitals and nursing note reviewed.  Constitutional:      Appearance: Normal appearance. She is normal weight.  HENT:     Head: Normocephalic and atraumatic.     Right Ear: Tympanic membrane, ear canal and external ear normal.     Left Ear: Tympanic membrane, ear canal and external ear normal.     Ears:     Comments: Right external ear (lobule): Erythematous, indurated area-please see image below    Mouth/Throat:     Mouth: Mucous membranes are moist.     Pharynx: Oropharynx is clear.  Eyes:     Extraocular Movements: Extraocular movements intact.     Conjunctiva/sclera: Conjunctivae normal.     Pupils: Pupils are equal, round, and reactive to light.  Cardiovascular:     Rate and Rhythm: Normal rate and regular rhythm.     Pulses: Normal  pulses.     Heart sounds: Normal heart sounds.  Pulmonary:     Effort: Pulmonary effort is normal.     Breath sounds: Normal breath sounds. No wheezing, rhonchi or rales.  Musculoskeletal:        General: Normal range of motion.     Cervical back: Normal range of motion and neck supple.  Skin:    General: Skin is warm and dry.  Neurological:     General: No focal deficit present.     Mental Status: She is alert and oriented to person, place, and time.      UC Treatments / Results  Labs (all labs ordered are listed, but only abnormal results are displayed) Labs Reviewed - No data to display  EKG   Radiology No results found.  Procedures Procedures (including critical care time)  Medications Ordered in UC Medications - No data to display  Initial Impression / Assessment and Plan / UC Course  I have reviewed the triage vital signs and the nursing notes.  Pertinent labs & imaging results that were available during my care of the patient were reviewed by me and considered in my medical decision making (see chart for details).     MDM: 1.  Cellulitis of right earlobe-Rx'd Doxycycline. Advised patient to take medication as directed with food to completion.  Encouraged patient increase daily water intake to 64 ounces per day while taking this medication.  Advised if symptoms worsen and/or unresolved please follow-up with PCP or here for further evaluation. Final Clinical Impressions(s) / UC Diagnoses   Final diagnoses:  Cellulitis of right earlobe     Discharge Instructions      Advised patient to take medication as directed with food to completion.  Encouraged patient increase daily water intake to 64 ounces per day while taking this medication.  Advised if symptoms worsen and/or unresolved please follow-up with PCP or here for further evaluation.     ED Prescriptions     Medication Sig Dispense Auth. Provider   doxycycline (VIBRAMYCIN) 100 MG capsule Take 1  capsule (100 mg total) by  mouth 2 (two) times daily for 7 days. 14 capsule Trevor Iha, FNP      PDMP not reviewed this encounter.   Trevor Iha, FNP 10/08/22 1333

## 2022-10-08 NOTE — ED Triage Notes (Signed)
Rt ear lobe infection, noticed today, Ear was pierced in February took earring out last night noticed ear lobe was irritated.

## 2022-10-17 ENCOUNTER — Other Ambulatory Visit: Payer: Self-pay | Admitting: Physician Assistant

## 2022-10-17 DIAGNOSIS — I10 Essential (primary) hypertension: Secondary | ICD-10-CM

## 2022-12-09 ENCOUNTER — Other Ambulatory Visit: Payer: Self-pay | Admitting: Physician Assistant

## 2022-12-09 DIAGNOSIS — I10 Essential (primary) hypertension: Secondary | ICD-10-CM

## 2023-01-02 DIAGNOSIS — R079 Chest pain, unspecified: Secondary | ICD-10-CM | POA: Diagnosis not present

## 2023-01-03 ENCOUNTER — Ambulatory Visit: Payer: BC Managed Care – PPO | Admitting: Physician Assistant

## 2023-02-08 ENCOUNTER — Other Ambulatory Visit: Payer: Self-pay | Admitting: Physician Assistant

## 2023-02-08 DIAGNOSIS — I10 Essential (primary) hypertension: Secondary | ICD-10-CM

## 2023-02-12 ENCOUNTER — Telehealth: Payer: Self-pay

## 2023-02-12 DIAGNOSIS — I10 Essential (primary) hypertension: Secondary | ICD-10-CM

## 2023-02-12 MED ORDER — TELMISARTAN-HCTZ 80-25 MG PO TABS: 1.0000 | ORAL_TABLET | Freq: Every day | ORAL | 0 refills | Status: DC

## 2023-02-12 MED ORDER — METOPROLOL TARTRATE 25 MG PO TABS: 25.0000 mg | ORAL_TABLET | Freq: Two times a day (BID) | ORAL | 0 refills | Status: DC

## 2023-02-12 MED ORDER — AMLODIPINE BESYLATE 2.5 MG PO TABS: 2.5000 mg | ORAL_TABLET | Freq: Every day | ORAL | 0 refills | Status: DC

## 2023-02-12 NOTE — Telephone Encounter (Signed)
One month sent, must keep appt for refills.

## 2023-02-12 NOTE — Telephone Encounter (Signed)
Patient stopped by to make an appointment for 03/04/2023 @ 8:50 and wanted to know if she could get some pills to hold her over until her appointment? She is currently out.   amLODipine (NORVASC) 2.5 MG tablet  metoprolol tartrate (LOPRESSOR) 25 MG tablet  telmisartan-hydrochlorothiazide (MICARDIS HCT) 80-25 MG tablet

## 2023-02-17 ENCOUNTER — Other Ambulatory Visit: Payer: Self-pay | Admitting: Physician Assistant

## 2023-02-17 DIAGNOSIS — I10 Essential (primary) hypertension: Secondary | ICD-10-CM

## 2023-02-17 DIAGNOSIS — R748 Abnormal levels of other serum enzymes: Secondary | ICD-10-CM

## 2023-02-25 ENCOUNTER — Telehealth: Payer: Self-pay | Admitting: Physician Assistant

## 2023-02-25 DIAGNOSIS — R160 Hepatomegaly, not elsewhere classified: Secondary | ICD-10-CM

## 2023-02-25 NOTE — Telephone Encounter (Signed)
-----   Message from Donella Stade, [place] sent at 04/16/2022  7:48 AM EDT ----- MRI with contrast to follow up on right liver mass.

## 2023-02-25 NOTE — Telephone Encounter (Signed)
Patient advised.

## 2023-03-04 ENCOUNTER — Ambulatory Visit (INDEPENDENT_AMBULATORY_CARE_PROVIDER_SITE_OTHER): Payer: BC Managed Care – PPO | Admitting: Physician Assistant

## 2023-03-04 ENCOUNTER — Encounter: Payer: Self-pay | Admitting: Physician Assistant

## 2023-03-04 VITALS — BP 127/71 | HR 74 | Ht 66.0 in | Wt 179.0 lb

## 2023-03-04 DIAGNOSIS — R160 Hepatomegaly, not elsewhere classified: Secondary | ICD-10-CM | POA: Diagnosis not present

## 2023-03-04 DIAGNOSIS — E559 Vitamin D deficiency, unspecified: Secondary | ICD-10-CM

## 2023-03-04 DIAGNOSIS — Z780 Asymptomatic menopausal state: Secondary | ICD-10-CM

## 2023-03-04 DIAGNOSIS — K760 Fatty (change of) liver, not elsewhere classified: Secondary | ICD-10-CM | POA: Diagnosis not present

## 2023-03-04 DIAGNOSIS — I10 Essential (primary) hypertension: Secondary | ICD-10-CM | POA: Diagnosis not present

## 2023-03-04 DIAGNOSIS — R748 Abnormal levels of other serum enzymes: Secondary | ICD-10-CM | POA: Diagnosis not present

## 2023-03-04 DIAGNOSIS — Z131 Encounter for screening for diabetes mellitus: Secondary | ICD-10-CM | POA: Diagnosis not present

## 2023-03-04 DIAGNOSIS — E663 Overweight: Secondary | ICD-10-CM

## 2023-03-04 MED ORDER — TELMISARTAN-HCTZ 80-25 MG PO TABS
1.0000 | ORAL_TABLET | Freq: Every day | ORAL | 1 refills | Status: AC
Start: 2023-03-04 — End: ?

## 2023-03-04 MED ORDER — AMLODIPINE BESYLATE 2.5 MG PO TABS
2.5000 mg | ORAL_TABLET | Freq: Every day | ORAL | 1 refills | Status: AC
Start: 2023-03-04 — End: ?

## 2023-03-04 MED ORDER — METOPROLOL TARTRATE 25 MG PO TABS
25.0000 mg | ORAL_TABLET | Freq: Two times a day (BID) | ORAL | 1 refills | Status: AC
Start: 2023-03-04 — End: ?

## 2023-03-04 NOTE — Progress Notes (Signed)
Established Patient Office Visit  Subjective   Patient ID: Kristine Arnold, female    DOB: Aug 30, 1971  Age: 52 y.o. MRN: [id number]  Chief Complaint  Patient presents with   Follow-up    HPI Pt is a 52 yo female with HTN who presents to the clinic for follow up.   She denies any CP, palpitations, SOB. She takes her medication daily. She does not check BP at home. She feels well. No concerns.   .. Active Ambulatory Problems    Diagnosis Date Noted   Abnormal uterine bleeding due to endometrial polyp 03/08/2021   Benign breast cyst in female, left 10/17/2021   Elevated liver enzymes 02/19/2022   Benign cyst of right kidney 03/06/2022   Liver mass, right lobe 03/06/2022   Essential hypertension 03/18/2022   Hepatic steatosis 04/16/2022   Gallstones 04/16/2022   Kidney stone on left side 04/16/2022   Resolved Ambulatory Problems    Diagnosis Date Noted   Uncontrolled hypertension 10/09/2021   Past Medical History:  Diagnosis Date   Hypertension    Joint pain      Review of Systems  All other systems reviewed and are negative.     Objective:     BP 127/71   Pulse 74   Ht 5\' 6"  (1.676 m)   Wt 179 lb (81.2 kg)   SpO2 99%   BMI 28.89 kg/m  BP Readings from Last 3 Encounters:  03/04/23 127/71  10/08/22 112/77  07/02/22 132/82   Wt Readings from Last 3 Encounters:  03/04/23 179 lb (81.2 kg)  10/08/22 178 lb (80.7 kg)  07/02/22 180 lb (81.6 kg)      Physical Exam Vitals reviewed.  Constitutional:      Appearance: Normal appearance.  HENT:     Head: Normocephalic.  Cardiovascular:     Rate and Rhythm: Normal rate and regular rhythm.     Pulses: Normal pulses.     Heart sounds: Normal heart sounds.  Musculoskeletal:        General: Normal range of motion.     Cervical back: Normal range of motion.     Right lower leg: No edema.     Left lower leg: No edema.  Neurological:     General: No focal deficit present.     Mental Status: She is alert  and oriented to person, place, and time.  Psychiatric:        Mood and Affect: Mood normal.      The 10-year ASCVD risk score (Arnett DK, et al., 2019) is: 1.5%    Assessment & Plan:  Marland KitchenMarland KitchenReather was seen today for follow-up.  Diagnoses and all orders for this visit:  Essential hypertension -     amLODipine (NORVASC) 2.5 MG tablet; Take 1 tablet (2.5 mg total) by mouth daily. -     metoprolol tartrate (LOPRESSOR) 25 MG tablet; Take 1 tablet (25 mg total) by mouth 2 (two) times daily. -     telmisartan-hydrochlorothiazide (MICARDIS HCT) 80-25 MG tablet; Take 1 tablet by mouth daily. -     COMPLETE METABOLIC PANEL WITH GFR -     CBC w/Diff/Platelet  Liver mass  Elevated liver enzymes -     Lipid Panel w/reflex Direct LDL  Hepatic steatosis  Screening for diabetes mellitus -     COMPLETE METABOLIC PANEL WITH GFR  Overweight -     TSH  Vitamin D insufficiency -     VITAMIN D 25 Hydroxy (Vit-D Deficiency, Fractures)  Post-menopausal -     VITAMIN D 25 Hydroxy (Vit-D Deficiency, Fractures)   Vitals looks great.  Continue same medications Sent refills for 6 months Labs ordered MR of abdomen has been ordered, pt encouraged to schedule to follow up on liver lesion   Return in about 6 months (around 09/03/2023).    Tandy Gaw, PA-C

## 2023-03-04 NOTE — Patient Instructions (Addendum)
Order MRI to follow up on liver lesion.  Get labs today.  Continue same medication.

## 2023-03-05 LAB — CBC WITH DIFFERENTIAL/PLATELET
Absolute Monocytes: 340 cells/uL (ref 200–950)
Basophils Absolute: 118 cells/uL (ref 0–200)
Basophils Relative: 1.6 %
Eosinophils Absolute: 252 cells/uL (ref 15–500)
Eosinophils Relative: 3.4 %
HCT: 36.8 % (ref 35.0–45.0)
Hemoglobin: 12.2 g/dL (ref 11.7–15.5)
Lymphs Abs: 1902 cells/uL (ref 850–3900)
MCH: 32.8 pg (ref 27.0–33.0)
MCHC: 33.2 g/dL (ref 32.0–36.0)
MCV: 98.9 fL (ref 80.0–100.0)
MPV: 10.9 fL (ref 7.5–12.5)
Monocytes Relative: 4.6 %
Neutro Abs: 4788 cells/uL (ref 1500–7800)
Neutrophils Relative %: 64.7 %
Platelets: 368 10*3/uL (ref 140–400)
RBC: 3.72 10*6/uL — ABNORMAL LOW (ref 3.80–5.10)
RDW: 13.1 % (ref 11.0–15.0)
Total Lymphocyte: 25.7 %
WBC: 7.4 10*3/uL (ref 3.8–10.8)

## 2023-03-05 LAB — COMPLETE METABOLIC PANEL WITH GFR
AG Ratio: 1.1 (calc) (ref 1.0–2.5)
ALT: 48 U/L — ABNORMAL HIGH (ref 6–29)
AST: 44 U/L — ABNORMAL HIGH (ref 10–35)
Albumin: 3.9 g/dL (ref 3.6–5.1)
Alkaline phosphatase (APISO): 65 U/L (ref 37–153)
BUN/Creatinine Ratio: 17 (calc) (ref 6–22)
BUN: 18 mg/dL (ref 7–25)
CO2: 18 mmol/L — ABNORMAL LOW (ref 20–32)
Calcium: 9.7 mg/dL (ref 8.6–10.4)
Chloride: 103 mmol/L (ref 98–110)
Creat: 1.09 mg/dL — ABNORMAL HIGH (ref 0.50–1.03)
Globulin: 3.5 g/dL (calc) (ref 1.9–3.7)
Glucose, Bld: 85 mg/dL (ref 65–99)
Potassium: 3.5 mmol/L (ref 3.5–5.3)
Sodium: 137 mmol/L (ref 135–146)
Total Bilirubin: 0.2 mg/dL (ref 0.2–1.2)
Total Protein: 7.4 g/dL (ref 6.1–8.1)
eGFR: 61 mL/min/{1.73_m2} (ref >=60)

## 2023-03-05 LAB — TSH: TSH: 1.57 mIU/L

## 2023-03-05 LAB — LIPID PANEL W/REFLEX DIRECT LDL
Cholesterol: 163 mg/dL (ref <200)
HDL: 49 mg/dL — ABNORMAL LOW (ref >=50)
LDL Cholesterol (Calc): 80 mg/dL (calc)
Non-HDL Cholesterol (Calc): 114 mg/dL (calc) (ref <130)
Total CHOL/HDL Ratio: 3.3 (calc) (ref <5.0)
Triglycerides: 243 mg/dL — ABNORMAL HIGH (ref <150)

## 2023-03-05 LAB — VITAMIN D 25 HYDROXY (VIT D DEFICIENCY, FRACTURES): Vit D, 25-Hydroxy: 43 ng/mL (ref 30–100)

## 2023-03-05 NOTE — Progress Notes (Signed)
Korrie,   Liver enzymes improved some. Continue to work on healthy diet and regular exercise.  Thyroid looks great.  Vitamin D in normal range. Make sure taking at least 1000 units daily.  TG are elevated. Decrease carbs and sugars in diet. Fish oil 4000mg  a day.

## 2023-03-11 ENCOUNTER — Other Ambulatory Visit: Payer: Self-pay | Admitting: Physician Assistant

## 2023-03-11 DIAGNOSIS — I10 Essential (primary) hypertension: Secondary | ICD-10-CM

## 2023-03-12 ENCOUNTER — Telehealth: Payer: Self-pay

## 2023-03-12 NOTE — Telephone Encounter (Signed)
Patient called states having problems filling two of her medications.  I called her pharmacy.  Pharmacist states patient picked up 30 day supply of both  Metoprolol on march 21st and the earliest she could pick up the 90/d supply is April 19th Telmisartan- hctz 80-25mg  on April 1st- earliest could be picked up as 90/d supply would be  April 28th Patient is leaving to go out of the state on April the 28th so will speak with the pharmacy to see what could be done as far as picking up the medication at that time .  If nothing could be done she will have the  prescription transferred to her location at the time.

## 2024-02-23 ENCOUNTER — Other Ambulatory Visit: Payer: Self-pay | Admitting: Physician Assistant

## 2024-02-23 DIAGNOSIS — R160 Hepatomegaly, not elsewhere classified: Secondary | ICD-10-CM

## 2024-07-27 ENCOUNTER — Encounter: Payer: Self-pay | Admitting: Sports Medicine
